# Patient Record
Sex: Female | Born: 1995 | Race: Black or African American | Hispanic: No | Marital: Married | Smoking: Never smoker
Health system: Southern US, Community
[De-identification: ages and names within clinical notes are randomized; demographics above are authoritative.]

## PROBLEM LIST (undated history)

## (undated) DIAGNOSIS — J45909 Unspecified asthma, uncomplicated: Secondary | ICD-10-CM

## (undated) DIAGNOSIS — F32A Depression, unspecified: Secondary | ICD-10-CM

## (undated) HISTORY — DX: Unspecified asthma, uncomplicated: J45.909

## (undated) HISTORY — PX: NO PAST SURGERIES: SHX2092

---
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
## 2018-08-05 DIAGNOSIS — Z008 Encounter for other general examination: Secondary | ICD-10-CM | POA: Diagnosis not present

## 2018-08-05 DIAGNOSIS — Z1329 Encounter for screening for other suspected endocrine disorder: Secondary | ICD-10-CM | POA: Diagnosis not present

## 2018-08-05 DIAGNOSIS — Z0000 Encounter for general adult medical examination without abnormal findings: Secondary | ICD-10-CM | POA: Diagnosis not present

## 2019-06-26 ENCOUNTER — Other Ambulatory Visit: Payer: Self-pay

## 2019-06-26 ENCOUNTER — Encounter: Payer: Self-pay | Admitting: Family Medicine

## 2019-06-26 ENCOUNTER — Ambulatory Visit (INDEPENDENT_AMBULATORY_CARE_PROVIDER_SITE_OTHER): Payer: Self-pay | Admitting: Family Medicine

## 2019-06-26 VITALS — BP 100/70 | HR 69 | Temp 96.4°F | Resp 12 | Ht 76.0 in | Wt 121.2 lb

## 2019-06-26 DIAGNOSIS — E559 Vitamin D deficiency, unspecified: Secondary | ICD-10-CM

## 2019-06-26 DIAGNOSIS — Z23 Encounter for immunization: Secondary | ICD-10-CM

## 2019-06-26 DIAGNOSIS — R636 Underweight: Secondary | ICD-10-CM

## 2019-06-26 DIAGNOSIS — R630 Anorexia: Secondary | ICD-10-CM

## 2019-06-26 HISTORY — DX: Underweight: R63.6

## 2019-06-26 LAB — BASIC METABOLIC PANEL
BUN: 10 mg/dL (ref 6–23)
CO2: 24 mEq/L (ref 19–32)
Calcium: 9.3 mg/dL (ref 8.4–10.5)
Chloride: 105 mEq/L (ref 96–112)
Creatinine, Ser: 0.62 mg/dL (ref 0.40–1.20)
GFR: 146.16 mL/min (ref >60.00)
Glucose, Bld: 89 mg/dL (ref 70–99)
Potassium: 4.1 mEq/L (ref 3.5–5.1)
Sodium: 138 mEq/L (ref 135–145)

## 2019-06-26 LAB — TSH: TSH: 1.87 u[IU]/mL (ref 0.35–4.50)

## 2019-06-26 LAB — CBC
HCT: 39.6 % (ref 36.0–46.0)
Hemoglobin: 13.2 g/dL (ref 12.0–15.0)
MCHC: 33.2 g/dL (ref 30.0–36.0)
MCV: 89.1 fl (ref 78.0–100.0)
Platelets: 264 10*3/uL (ref 150.0–400.0)
RBC: 4.44 Mil/uL (ref 3.87–5.11)
RDW: 13.6 % (ref 11.5–15.5)
WBC: 6.3 10*3/uL (ref 4.0–10.5)

## 2019-06-26 LAB — VITAMIN D 25 HYDROXY (VIT D DEFICIENCY, FRACTURES): VITD: 21.91 ng/mL — ABNORMAL LOW (ref 30.00–100.00)

## 2019-06-26 MED ORDER — MIRTAZAPINE 7.5 MG PO TABS: 7.5000 mg | ORAL_TABLET | Freq: Every day | ORAL | 2 refills | Status: DC

## 2019-06-26 NOTE — Progress Notes (Signed)
HPI:   Lindsey Decker is a 23 y.o. female, who is here today to establish care.  Former PCP: N/A Last preventive routine visit: > 1-2 years ago. She lives with her parents and siblings (4).  Exercises 1/week. In general she follows a healthful.  Chronic medical problems: Vit D deficiency. + Fatigue, which improved with vit D supplementation. 25 [place] vit D reported to be low at 16 about 5 months ago. She is not longer on vit D supplementation at this time. Sleeps well,about 10 hours,she feels rested when she wakes up.  Asthma: Exacerbated by respiratory viral illnesses.  She is concerned about decreased appetite. Afraid of losing wt, already underwt. Regular menses. She would like something that helps.  Negative for fever,chills,night sweats,palpitation,tremos,or diarreha.   Review of Systems  Constitutional: Negative for activity change, appetite change and unexpected weight change.  HENT: Negative for mouth sores, nosebleeds and trouble swallowing.   Eyes: Negative for redness and visual disturbance.  Respiratory: Negative for cough, shortness of breath and wheezing.   Cardiovascular: Negative for chest pain and leg swelling.  Gastrointestinal: Negative for abdominal pain, nausea and vomiting.       Negative for changes in bowel habits.  Endocrine: Negative for cold intolerance and heat intolerance.  Genitourinary: Negative for decreased urine volume, dysuria and hematuria.  Musculoskeletal: Negative for arthralgias and myalgias.  Allergic/Immunologic: Positive for environmental allergies.  Neurological: Negative for syncope, weakness and headaches.  Rest see pertinent positives and negatives per HPI.   No current outpatient medications on file prior to visit.   No current facility-administered medications on file prior to visit.   Past Medical History:  Diagnosis Date  . Asthma    No Known Allergies  History reviewed. No pertinent family  history.  Social History   Socioeconomic History  . Marital status: Single    Spouse name: Not on file  . Number of children: Not on file  . Years of education: Not on file  . Highest education level: Not on file  Occupational History  . Not on file  Tobacco Use  . Smoking status: Never Smoker  . Smokeless tobacco: Never Used  Substance and Sexual Activity  . Alcohol use: Not on file  . Drug use: Not on file  . Sexual activity: Not on file  Other Topics Concern  . Not on file  Social History Narrative  . Not on file   Social Determinants of Health   Financial Resource Strain:   . Difficulty of Paying Living Expenses: Not on file  Food Insecurity:   . Worried About Programme researcher, broadcasting/film/video in the Last Year: Not on file  . Ran Out of Food in the Last Year: Not on file  Transportation Needs:   . Lack of Transportation (Medical): Not on file  . Lack of Transportation (Non-Medical): Not on file  Physical Activity:   . Days of Exercise per Week: Not on file  . Minutes of Exercise per Session: Not on file  Stress:   . Feeling of Stress : Not on file  Social Connections:   . Frequency of Communication with Friends and Family: Not on file  . Frequency of Social Gatherings with Friends and Family: Not on file  . Attends Religious Services: Not on file  . Active Member of Clubs or Organizations: Not on file  . Attends Banker Meetings: Not on file  . Marital Status: Not on file    Vitals:  06/26/19 0658  BP: 100/70  Pulse: 69  Resp: 12  Temp: (!) 96.4 F (35.8 C)  SpO2: 98%    Body mass index is 14.76 kg/m.   Physical Exam  Nursing note and vitals reviewed. Constitutional: She is oriented to person, place, and time. She appears well-developed. No distress.  HENT:  Head: Normocephalic and atraumatic.  Mouth/Throat: Oropharynx is clear and moist and mucous membranes are normal.  Eyes: Pupils are equal, round, and reactive to light. Conjunctivae are  normal.  Cardiovascular: Normal rate and regular rhythm.  No murmur heard. Pulses:      Dorsalis pedis pulses are 2+ on the right side and 2+ on the left side.  Respiratory: Effort normal and breath sounds normal. No respiratory distress.  GI: Soft. She exhibits no mass. There is no hepatomegaly. There is no abdominal tenderness.  Musculoskeletal:        General: No edema.  Lymphadenopathy:    She has no cervical adenopathy.  Neurological: She is alert and oriented to person, place, and time. She has normal strength. No cranial nerve deficit. Gait normal.  Skin: Skin is warm. No rash noted. No erythema.  Psychiatric: She has a normal mood and affect.  Well groomed, good eye contact.    ASSESSMENT AND PLAN:  Jeimy was seen today for establish care.  Diagnoses and all orders for this visit:  Lab Results  Component Value Date   WBC 6.3 06/26/2019   HGB 13.2 06/26/2019   HCT 39.6 06/26/2019   MCV 89.1 06/26/2019   PLT 264.0 06/26/2019   Lab Results  Component Value Date   TSH 1.87 06/26/2019   Lab Results  Component Value Date   CREATININE 0.62 06/26/2019   BUN 10 06/26/2019   NA 138 06/26/2019   K 4.1 06/26/2019   CL 105 06/26/2019   CO2 24 06/26/2019    Decreased appetite Mirtazapine 7.5 mg added today. Protein containing snacks in between meals may help.  -     mirtazapine (REMERON) 7.5 MG tablet; Take 1 tablet (7.5 mg total) by mouth at bedtime.  Vitamin D deficiency, unspecified Further recommendations will be given according to lab results.  -     VITAMIN D 25 Hydroxy (Vit-D Deficiency, Fractures)  Underweight Stable wt. Regular exercise that includes wt lifting recommended. Mirtazapine may help,some side effects discussed.  -     CBC -     TSH -     Basic Metabolic Panel  Need for influenza vaccination -     Flu Vaccine QUAD 6+ mos PF IM (Fluarix Quad PF)   Return in about 3 months (around 09/24/2019).   Kevontae Burgoon G. Martinique, MD  [facility] [place] office.

## 2019-06-26 NOTE — Patient Instructions (Signed)
A few things to remember from today's visit:   Decreased appetite - Plan: mirtazapine (REMERON) 7.5 MG tablet  Vitamin D deficiency, unspecified - Plan: VITAMIN D 25 Hydroxy (Vit-D Deficiency, Fractures)  Underweight - Plan: CBC, TSH, Basic Metabolic Panel  Today mirtazapine was added to try to help with appetite. Regular physical activity, you would benefit from weight lifting.  Please be sure medication list is accurate. If a new problem present, please set up appointment sooner than planned today.

## 2019-06-29 ENCOUNTER — Encounter: Payer: Self-pay | Admitting: Family Medicine

## 2019-08-23 ENCOUNTER — Telehealth: Payer: Self-pay | Admitting: Family Medicine

## 2019-08-23 NOTE — Telephone Encounter (Signed)
Checked pt's [facility] record. Last DTAP was 03/25/1999 and last MMR vaccine was 03/25/1999. Okay for tdap and last MMR vaccine?

## 2019-08-23 NOTE — Telephone Encounter (Signed)
Pt called in stating that she needs the following vaccines MMR2 and Tdap pt would like to have a call if she is eligible for the vaccines so that she can get scheduled.

## 2019-08-25 NOTE — Telephone Encounter (Signed)
She is due for Tdap. In regard to MMR, she does not need a second one if childhood vaccination was completed (MMR x 2). Thanks, BJ

## 2019-08-25 NOTE — Telephone Encounter (Signed)
Okay to schedule NV for tdap & mmr.

## 2019-08-29 ENCOUNTER — Other Ambulatory Visit: Payer: Self-pay

## 2019-08-30 ENCOUNTER — Ambulatory Visit (INDEPENDENT_AMBULATORY_CARE_PROVIDER_SITE_OTHER): Payer: Self-pay | Admitting: *Deleted

## 2019-08-30 ENCOUNTER — Other Ambulatory Visit: Payer: Self-pay

## 2019-08-30 DIAGNOSIS — Z23 Encounter for immunization: Secondary | ICD-10-CM

## 2019-08-30 NOTE — Progress Notes (Signed)
Per orders of Dr. Martinique, injection of Tdap and MMR given by Westley Hummer. Patient tolerated injection well.

## 2019-10-28 ENCOUNTER — Other Ambulatory Visit: Payer: Self-pay | Admitting: Family Medicine

## 2019-10-28 DIAGNOSIS — R630 Anorexia: Secondary | ICD-10-CM

## 2020-03-12 ENCOUNTER — Ambulatory Visit (INDEPENDENT_AMBULATORY_CARE_PROVIDER_SITE_OTHER): Payer: BC Managed Care – PPO | Admitting: Family Medicine

## 2020-03-12 ENCOUNTER — Other Ambulatory Visit: Payer: Self-pay

## 2020-03-12 ENCOUNTER — Encounter: Payer: Self-pay | Admitting: Family Medicine

## 2020-03-12 VITALS — BP 100/70 | HR 74 | Temp 98.4°F | Resp 12 | Ht 76.0 in | Wt 142.0 lb

## 2020-03-12 DIAGNOSIS — F419 Anxiety disorder, unspecified: Secondary | ICD-10-CM

## 2020-03-12 DIAGNOSIS — H6122 Impacted cerumen, left ear: Secondary | ICD-10-CM

## 2020-03-12 DIAGNOSIS — R630 Anorexia: Secondary | ICD-10-CM

## 2020-03-12 DIAGNOSIS — N938 Other specified abnormal uterine and vaginal bleeding: Secondary | ICD-10-CM | POA: Diagnosis not present

## 2020-03-12 DIAGNOSIS — H9193 Unspecified hearing loss, bilateral: Secondary | ICD-10-CM

## 2020-03-12 LAB — POCT URINE PREGNANCY: Preg Test, Ur: NEGATIVE

## 2020-03-12 MED ORDER — MIRTAZAPINE 7.5 MG PO TABS: 7.5000 mg | ORAL_TABLET | Freq: Every day | ORAL | 0 refills | Status: DC

## 2020-03-12 NOTE — Patient Instructions (Addendum)
A few things to remember from today's visit:   DUB (dysfunctional uterine bleeding)  Bilateral hearing loss, unspecified hearing loss type - Plan: Ambulatory referral to ENT  Hearing loss of left ear due to cerumen impaction - Plan: Ambulatory referral to ENT  Decreased appetite - Plan: mirtazapine (REMERON) 7.5 MG tablet  Anxiety disorder, unspecified type  Increase the dose of mirtazapine back to 7.5 mg. Arrange appointment with psychiatrist. ENT referral was placed, somebody will call you from their office for an appointment.  PSYCHIATRIC OFFICES AND PSYCHIATRISTS IN THE AREA   When psychiatric evaluation is discussed or recommended referral is not necessary. This is a list of options around [place], [place] that you can call and arrange an appointment.  -[facility] [phone] -[facility] [phone] [facility] [phone] -[facility] [phone]  Dr Annabell Sabal [phone] Dr Marguerita Beards, MD [phone] Dr Milagros Evener, MD [phone])   Dr Jacqulynn Cadet. Bernardo Heater, MD [phone] Dr Madie Reno [facility]  Dr Andee Poles, MD [phone])  Dr Mar Daring, MD [phone]  Dr Laverna Peace [phone]  If you need refills please call your pharmacy. Do not use My Chart to request refills or for acute issues that need immediate attention.    Please be sure medication list is accurate. If a new problem present, please set up appointment sooner than planned today.

## 2020-03-12 NOTE — Progress Notes (Signed)
Chief Complaint  Patient presents with  . hearing problems   HPI: LindseyAdriena Chanti Decker is a 24 y.o. female, who is here today with her husband with above concerned. For about 3 months she has had "closed" ear feeling, gradual onset. Initially problem was intermittent but has been constant for the past 2 months. Negative for fever,chills,sore throat, nasal congestion,or rhinorrhea. No ear ache or drainage.  She tried OTC ear drops, did not help.  Denies recent travel or URI.  Her husband is concerned about abnormal menstrual period. She did a home pregnancy test and it was negative. Mild lower abdominal pain. She has not noted breast tenderness. Denies changes in bowel habits,N/V,or urinary symptoms.  Vaginal spotting on 02/23/20, stopped and LMP 03/07/20, "normal." She is not on birth control.  She is also c/o having "panic attacks" for the past few weeks. She would like a refill on Mirtazapine. Mirtazapine 7.5 mg was prescribed on 06/26/19 to help with appetite, it did help. She ran out of medication a few months ago. She feels like medication also help with anxiety. Lab Results  Component Value Date   TSH 1.87 06/26/2019   Lab Results  Component Value Date   CREATININE 0.62 06/26/2019   BUN 10 06/26/2019   NA 138 06/26/2019   K 4.1 06/26/2019   CL 105 06/26/2019   CO2 24 06/26/2019   Lab Results  Component Value Date   WBC 6.3 06/26/2019   HGB 13.2 06/26/2019   HCT 39.6 06/26/2019   MCV 89.1 06/26/2019   PLT 264.0 06/26/2019   No depressed mood or suicidal thoughts.  Her husband would like a referral to psychiatrist.  Review of Systems  Constitutional: Negative for activity change, appetite change and fatigue.  HENT: Negative for nosebleeds, sinus pain and voice change.   Respiratory: Negative for cough, shortness of breath and wheezing.   Cardiovascular: Negative for chest pain and leg swelling.  Genitourinary: Negative for decreased urine volume,  dyspareunia, dysuria, hematuria and vaginal discharge.  Musculoskeletal: Negative for gait problem and myalgias.  Skin: Negative for rash.  Allergic/Immunologic: Positive for environmental allergies.  Neurological: Negative for syncope, weakness and headaches.  Psychiatric/Behavioral: Negative for confusion and hallucinations.  Rest see pertinent positives and negatives per HPI.  No current outpatient medications on file prior to visit.   No current facility-administered medications on file prior to visit.   Past Medical History:  Diagnosis Date  . Asthma    No Known Allergies  Social History   Socioeconomic History  . Marital status: Married    Spouse name: Not on file  . Number of children: Not on file  . Years of education: Not on file  . Highest education level: Not on file  Occupational History  . Not on file  Tobacco Use  . Smoking status: Never Smoker  . Smokeless tobacco: Never Used  Substance and Sexual Activity  . Alcohol use: Not on file  . Drug use: Not on file  . Sexual activity: Not on file  Other Topics Concern  . Not on file  Social History Narrative  . Not on file   Social Determinants of Health   Financial Resource Strain:   . Difficulty of Paying Living Expenses: Not on file  Food Insecurity:   . Worried About Programme researcher, broadcasting/film/video in the Last Year: Not on file  . Ran Out of Food in the Last Year: Not on file  Transportation Needs:   . Lack of Transportation (  Medical): Not on file  . Lack of Transportation (Non-Medical): Not on file  Physical Activity:   . Days of Exercise per Week: Not on file  . Minutes of Exercise per Session: Not on file  Stress:   . Feeling of Stress : Not on file  Social Connections:   . Frequency of Communication with Friends and Family: Not on file  . Frequency of Social Gatherings with Friends and Family: Not on file  . Attends Religious Services: Not on file  . Active Member of Clubs or Organizations: Not on file    . Attends Banker Meetings: Not on file  . Marital Status: Not on file   Vitals:   03/12/20 1556  BP: 100/70  Pulse: 74  Resp: 12  Temp: 98.4 F (36.9 C)  SpO2: 100%   Wt Readings from Last 3 Encounters:  03/12/20 142 lb (64.4 kg)  06/26/19 121 lb 4 oz (55 kg)    Body mass index is 17.28 kg/m.  Physical Exam Vitals and nursing note reviewed.  Constitutional:      General: She is not in acute distress.    Appearance: She is well-developed and normal weight.  HENT:     Head: Normocephalic and atraumatic.     Right Ear: Ear canal and external ear normal. Tympanic membrane is bulging.     Left Ear: External ear normal.     Ears:     Comments: Cerumen impaction left ear.    Mouth/Throat:     Mouth: Mucous membranes are moist.     Pharynx: Oropharynx is clear.  Eyes:     Conjunctiva/sclera: Conjunctivae normal.     Pupils: Pupils are equal, round, and reactive to light.  Cardiovascular:     Rate and Rhythm: Normal rate and regular rhythm.     Heart sounds: No murmur heard.   Pulmonary:     Effort: Pulmonary effort is normal. No respiratory distress.     Breath sounds: Normal breath sounds.  Abdominal:     Palpations: Abdomen is soft. There is no hepatomegaly or mass.     Tenderness: There is abdominal tenderness in the periumbilical area. There is no guarding or rebound.    Genitourinary:    Comments: Deferred for next visit or to gyn. Lymphadenopathy:     Cervical: No cervical adenopathy.  Skin:    General: Skin is warm.     Findings: No erythema or rash.  Neurological:     Mental Status: She is alert and oriented to person, place, and time.     Cranial Nerves: No cranial nerve deficit.     Gait: Gait normal.  Psychiatric:        Mood and Affect: Affect normal. Mood is anxious.     Comments: Well groomed, good eye contact.   ASSESSMENT AND PLAN:  Lindsey Decker was seen today for hearing problems.  Diagnoses and all orders for this  visit: Orders Placed This Encounter  Procedures  . Ambulatory referral to ENT  . POCT urine pregnancy    DUB (dysfunctional uterine bleeding) We discussed possible etiologies. I do not think blood work is needed today, if problem becomes recurrent it will be considered. Pregnancy test negative.  Bilateral hearing loss, unspecified hearing loss type Hearing screening abnormal. Left ear haring loss could be associated with cerumen impaction. Right eustachian tube dysfunction. ENT appt will be arranged. Instructed about warning signs.   Hearing Screening   125Hz  250Hz  500Hz  1000Hz  2000Hz  3000Hz  4000Hz   6000Hz  8000Hz   Right ear:   Fail Fail Fail  Pass    Left ear:   Fail Fail Fail  Pass       Hearing loss of left ear due to cerumen impaction After verbal consent and explaining risk of procedure she would like to have ear lavage. Still cerumen excess in ear canal, TM seen partially, no erythema. Reporting mild improvement.  Decreased appetite Medication was helping with appetite.  -     mirtazapine (REMERON) 7.5 MG tablet; Take 1 tablet (7.5 mg total) by mouth at bedtime.  Anxiety disorder, unspecified type Mirtazapine helped, so she will resume. Some side effects discussed. A list of psychiatrist and mental health providers in the area given on AVS. Explained that referral is not needed. Continue following with psychiatrist.  -     mirtazapine (REMERON) 7.5 MG tablet; Take 1 tablet (7.5 mg total) by mouth at bedtime.   Return if symptoms worsen or fail to improve.   Sritha Chauncey G. , MD  [facility]. [place] office.  A few things to remember from today's visit:   DUB (dysfunctional uterine bleeding)  Bilateral hearing loss, unspecified hearing loss type - Plan: Ambulatory referral to ENT  Hearing loss of left ear due to cerumen impaction - Plan: Ambulatory referral to ENT  Decreased appetite - Plan: mirtazapine (REMERON) 7.5 MG tablet  Anxiety  disorder, unspecified type  Increase the dose of mirtazapine back to 7.5 mg. Arrange appointment with psychiatrist. ENT referral was placed, somebody will call you from their office for an appointment.  PSYCHIATRIC OFFICES AND PSYCHIATRISTS IN THE AREA   When psychiatric evaluation is discussed or recommended referral is not necessary. This is a list of options around [place], [facility] that you can call and arrange an appointment.  -[facility] [phone] -[facility] [phone] [facility] [phone] -[facility] [phone]  Dr [phone] Dr Annabell Sabal, MD [phone] Dr Marguerita Beards, MD [phone])   Dr [phone]. (671, MD [phone] Dr Bernardo Heater A. [facility]  Dr Madie Reno, MD [phone])  Dr Andee Poles, MD [phone]  Dr Mar Daring [phone]  If you need refills please call your pharmacy. Do not use My Chart to request refills or for acute issues that need immediate attention.    Please be sure medication list is accurate. If a new problem present, please set up appointment sooner than planned today.

## 2020-03-22 DIAGNOSIS — Z0100 Encounter for examination of eyes and vision without abnormal findings: Secondary | ICD-10-CM | POA: Diagnosis not present

## 2020-03-25 DIAGNOSIS — H6122 Impacted cerumen, left ear: Secondary | ICD-10-CM | POA: Diagnosis not present

## 2020-03-25 DIAGNOSIS — H9312 Tinnitus, left ear: Secondary | ICD-10-CM | POA: Diagnosis not present

## 2020-03-25 DIAGNOSIS — H93293 Other abnormal auditory perceptions, bilateral: Secondary | ICD-10-CM | POA: Diagnosis not present

## 2020-04-15 DIAGNOSIS — F4011 Social phobia, generalized: Secondary | ICD-10-CM | POA: Diagnosis not present

## 2020-04-15 DIAGNOSIS — F4322 Adjustment disorder with anxiety: Secondary | ICD-10-CM | POA: Diagnosis not present

## 2020-05-10 ENCOUNTER — Ambulatory Visit (HOSPITAL_COMMUNITY)
Admission: EM | Admit: 2020-05-10 | Discharge: 2020-05-10 | Disposition: A | Discharge status: Home / Self Care | Payer: BC Managed Care – PPO

## 2020-05-10 ENCOUNTER — Encounter (HOSPITAL_COMMUNITY): Payer: Self-pay | Admitting: Emergency Medicine

## 2020-05-10 ENCOUNTER — Other Ambulatory Visit: Payer: Self-pay

## 2020-05-10 DIAGNOSIS — R630 Anorexia: Secondary | ICD-10-CM

## 2020-05-10 DIAGNOSIS — F419 Anxiety disorder, unspecified: Secondary | ICD-10-CM

## 2020-05-10 NOTE — ED Triage Notes (Signed)
Presents with Depression and Anxiety increasing in severity x 2 weeks.  Denies SI.

## 2020-05-10 NOTE — BH Assessment (Signed)
Comprehensive Clinical Assessment (CCA) Screening, Triage and Referral Note  05/10/2020 Lindsey Decker [id number]    Lindsey Decker is a 24 y.o. female who presents to [facility] voluntarily accompanied by her husband for depression and anxiety. Pt states that her symptoms began about a year ago but she notice it has gotten worse since her husband had a psychotic break a few months ago. Pt states her husbands mental health affected her, no other stressors. Pt currently denies SI, HI, AVH and SIB, reports no self harm or Lindsey Decker previous attempts. Pt reports good appetite and good sleep, states she has had panic attacks in the past. Pt reports no hx of trauma/abuse, no family mental health hx or substance abuse. Pt denies any drug or alcohol abuse. Pt states she does not have a psych provider, but PCP  prescribed Remaron 7.5 mg daily. She disclosed it was initially prescribed because of poor appitate but it has also helped with her depression and her anxiety symptoms. Pt reports she is seeking therapy and med management to help with anxiety. TTS provided resources pt to follow back up at [facility] for appointment in outpatient    Diagnosis MDD, moderate         GAD  Disposition: Lindsey Paddy, FNP recommends pt is psych cleared, TTS provided pt with outpatient resources.      Visit Diagnosis:    ICD-10-CM   1. Decreased appetite  R63.0   2. Anxiety disorder, unspecified type  F41.9     Patient Reported Information How did you hear about [place]? Family/Friend   Referral name: Spouse (Spouse)   Referral phone number: No data recorded Whom do you see for routine medical problems? Primary Care   Practice/Facility Name: No data recorded  Practice/Facility Phone Number: No data recorded  Name of Contact: No data recorded  Contact Number: No data recorded  Contact Fax Number: No data recorded  Prescriber Name: No data recorded  Prescriber Address (if known): No data  recorded What Is the Reason for Your Visit/Call Today? depression and anxiety (depression and anxiety)  How Long Has This Been Causing You Problems? > than 6 months  Have You Recently Been in Any Inpatient Treatment (Hospital/Detox/Crisis Center/28-Day Program)? No   Name/Location of Program/Hospital:No data recorded  How Long Were You There? No data recorded  When Were You Discharged? No data recorded Have You Ever Received Services From [facility] Before? No   Who Do You See at [facility]? No data recorded Have You Recently Had Any Thoughts About Hurting Yourself? No   Are You Planning to Commit Suicide/Harm Yourself At This time?  No  Have you Recently Had Thoughts About Hurting Someone Lindsey Decker? No   Explanation: No data recorded Have You Used Any Alcohol or Drugs in the Past 24 Hours? No   How Long Ago Did You Use Drugs or Alcohol?  No data recorded  What Did You Use and How Much? No data recorded What Do You Feel Would Help You the Most Today? Assessment Only  Do You Currently Have a Therapist/Psychiatrist? No   Name of Therapist/Psychiatrist: No data recorded  Have You Been Recently Discharged From Any Office Practice or Programs? No   Explanation of Discharge From Practice/Program:  No data recorded    CCA Screening Triage Referral Assessment Type of Contact: Face-to-Face   Is this Initial or Reassessment? Initial  Date Telepsych consult ordered in [facility]:  05/10/20  Time Telepsych consult ordered in [facility]:  No data recorded  Patient Reported Information Reviewed? Yes   Patient Left Without Being Seen? No data recorded  Reason for Not Completing Assessment: No data recorded Collateral Involvement: No data recorded Does Patient Have a Court Appointed Legal Guardian? No data recorded  Name and Contact of Legal Guardian:  No data recorded If Minor and Not Living with Parent(s), Who has Custody? No data recorded Is CPS involved or ever been involved? No data recorded Is  APS involved or ever been involved? No data recorded Patient Determined To Be At Risk for Harm To Self or Others Based on Review of Patient Reported Information or Presenting Complaint? No data recorded  Method: No data recorded  Availability of Means: No data recorded  Intent: No data recorded  Notification Required: No data recorded  Additional Information for Danger to Others Potential:  No data recorded  Additional Comments for Danger to Others Potential:  No data recorded  Are There Guns or Other Weapons in Your Home?  No data recorded   Types of Guns/Weapons: No data recorded   Are These Weapons Safely Secured?                              No data recorded   Who Could Verify You Are Able To Have These Secured:    No data recorded Do You Have any Outstanding Charges, Pending Court Dates, Parole/Probation? No data recorded Contacted To Inform of Risk of Harm To Self or Others: No data recorded Location of Assessment: [facility] Assessment Services  Does Patient Present under Involuntary Commitment? No   IVC Papers Initial File Date: No data recorded  [place] of Residence: [place]  Patient Currently Receiving the Following Services: Not Receiving Services   Determination of Need: Routine (7 days)   Options For Referral: Medication Management;Outpatient Therapy;Other: Comment   Lindsey Decker, LCSWA

## 2020-05-10 NOTE — ED Provider Notes (Signed)
[facility] Medical Screening Exam  Patient Name: Lindsey Decker MRN: [id number] Date of Evaluation: 05/10/20 Chief Complaint: Anxiety and Depression Diagnosis: Major Depressive disorder, moderate Generalized Anxiety disorder   History of Present illness: Lindsey Decker is a 24 y.o. female patient was seen at [facility] for a psychiatric evaluation due to increased depression and anxiety. The patient was accompanied by her husband, who requested to be by her side during the assessment. The patient disclosed that her symptoms began about a year ago, but she noticed it has gotten worse since her husband had a psychotic break a few months ago. 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 The patient disclosed she had been prescribed Remeron 7.5 mg daily. She revealed it was initially prescribed because of poor appetite, but it has also helped with her depression and anxiety symptoms. She disclosed that these symptoms have been getting worse, and she came in to help keep them at bay. During the patient assessment, she is alert and oriented x 4, pleasant, and cooperative. Speech is clear and coherent, average pace, normal volume.  Her mood is euthymic, and her affect is congruent with her mood. Her thought process is coherent. Reports no auditory or visual hallucinations. No indication that the patient is responding to internal or external stimuli. She denies paranoia. No evidence of delusional thought content. She denies suicidal or homicidal ideations. She denies substance abuse. The patient denies ever being hospitalized for any mental health problems. The patient denies any family history of mental illness. She was able to answer questions appropriately.  Psychiatric Specialty Exam  Presentation  General Appearance:Appropriate for Environment  Eye Contact:Good  Speech:Clear and Coherent  Speech Volume:Normal  Handedness:Right   Mood  and Affect  Mood:Anxious;Depressed  Affect:Congruent   Thought Process  Thought Processes:Coherent  Descriptions of Associations:Intact  Orientation:Full (Time, Place and Person)  Thought Content:WDL;Logical  Hallucinations:None  Ideas of Reference:None  Suicidal Thoughts:No  Homicidal Thoughts:No   Sensorium  Memory:Recent Good;Immediate Good;Remote Good  Judgment:Good  Insight:Good   Executive Functions  Concentration:Good  Attention Span:Good  Recall:Good  Fund of Knowledge:No data recorded Language:Good   Psychomotor Activity  Psychomotor Activity:Normal   Assets  Assets:Desire for Improvement;Resilience;Social Support   Sleep  Sleep:Good  Number of hours: 8   Physical Exam: Physical Exam Vitals and nursing note reviewed. Exam conducted with a chaperone present.  Constitutional:      Appearance: Normal appearance. She is normal weight.  HENT:     Right Ear: Tympanic membrane normal.     Left Ear: Tympanic membrane normal.     Nose: Nose normal.  Cardiovascular:     Rate and Rhythm: Normal rate.     Pulses: Normal pulses.  Pulmonary:     Effort: Pulmonary effort is normal.  Musculoskeletal:        General: Normal range of motion.     Cervical back: Normal range of motion and neck supple.  Neurological:     General: No focal deficit present.     Mental Status: She is alert and oriented to person, place, and time.  Psychiatric:        Attention and Perception: Attention and perception normal.        Mood and Affect: Mood is anxious and depressed.        Speech: Speech normal.        Behavior: Behavior normal. Behavior is cooperative.        Thought Content: Thought content  normal.        Cognition and Memory: Cognition normal.        Judgment: Judgment normal.    Review of Systems  Psychiatric/Behavioral: Positive for depression. The patient is nervous/anxious.   All other systems reviewed and are negative.  Blood pressure  114/74, pulse 80, temperature 99.1 F (37.3 C), resp. rate 16, SpO2 100 %. There is no height or weight on file to calculate BMI.  Musculoskeletal: Strength & Muscle Tone: within normal limits Gait & Station: normal Patient leans: N/A   [facility] MSE Discharge Disposition for Follow up and Recommendations: Based on my evaluation the patient does not appear to have an emergency medical condition and can be discharged with resources and follow up care in outpatient services for Medication Management and Individual Therapy   Gillermo Murdoch, NP 05/10/2020, 8:41 PM

## 2020-05-10 NOTE — Discharge Instructions (Addendum)
Out patient psychiatric resources provided to the patient. Patient is to continue with Rx provided by her PCP.

## 2020-07-13 NOTE — L&D Delivery Note (Signed)
Delivery Note Labor onset: 06/10/2021  Labor Onset Time: 2300 Complete dilation at 8:57 AM  Onset of pushing at 1030, labored down and started pushing again at 1200 FHR second stage Cat 2 Analgesia/Anesthesia intrapartum: epidural  Guided pushing with maternal urge. Delivery of a viable female at 29. Fetal head delivered in ROA position.  Nuchal cord: x 1.  Infant placed on maternal abd, dried, and tactile stim. Spontaneous cry Cord double clamped after pulsation ceased and cut by FOB.  2 Rns present for birth.  Cord blood sample collected: yes Arterial cord blood sample collected: N/A  Placenta delivered Tomasa Blase via Binnie Kand Maneuver, intact, with 3 VC.  AMTSL Placenta to L&D. Uterine tone firm U/2, bleeding minimal  2nd degree laceration identified.  Anesthesia: epidural Repair: 3-0 vycril QBL/EBL (mL): 150 Complications: Nuchal x 1 APGAR: APGAR (1 MIN): 9   APGAR (5 MINS): 9   APGAR (10 MINS):   Mom to postpartum.  Baby to Couplet care / Skin to Skin. Dr Sallye Ober notified of pt status and POC  Carollee Leitz MSN, CNM 06/11/2021, 2:08 PM

## 2020-11-19 DIAGNOSIS — Z681 Body mass index (BMI) 19 or less, adult: Secondary | ICD-10-CM | POA: Diagnosis not present

## 2020-11-19 DIAGNOSIS — Z0001 Encounter for general adult medical examination with abnormal findings: Secondary | ICD-10-CM | POA: Diagnosis not present

## 2020-11-19 DIAGNOSIS — Z0000 Encounter for general adult medical examination without abnormal findings: Secondary | ICD-10-CM | POA: Diagnosis not present

## 2020-11-19 DIAGNOSIS — R112 Nausea with vomiting, unspecified: Secondary | ICD-10-CM | POA: Diagnosis not present

## 2020-11-19 DIAGNOSIS — Z113 Encounter for screening for infections with a predominantly sexual mode of transmission: Secondary | ICD-10-CM | POA: Diagnosis not present

## 2020-11-19 DIAGNOSIS — Z124 Encounter for screening for malignant neoplasm of cervix: Secondary | ICD-10-CM | POA: Diagnosis not present

## 2020-11-19 DIAGNOSIS — Z01411 Encounter for gynecological examination (general) (routine) with abnormal findings: Secondary | ICD-10-CM | POA: Diagnosis not present

## 2020-11-19 DIAGNOSIS — N912 Amenorrhea, unspecified: Secondary | ICD-10-CM | POA: Diagnosis not present

## 2020-12-12 DIAGNOSIS — Z3A14 14 weeks gestation of pregnancy: Secondary | ICD-10-CM | POA: Diagnosis not present

## 2020-12-12 DIAGNOSIS — N925 Other specified irregular menstruation: Secondary | ICD-10-CM | POA: Diagnosis not present

## 2020-12-12 DIAGNOSIS — F419 Anxiety disorder, unspecified: Secondary | ICD-10-CM | POA: Diagnosis not present

## 2020-12-12 DIAGNOSIS — O3680X9 Pregnancy with inconclusive fetal viability, other fetus: Secondary | ICD-10-CM | POA: Diagnosis not present

## 2020-12-12 DIAGNOSIS — Z3402 Encounter for supervision of normal first pregnancy, second trimester: Secondary | ICD-10-CM | POA: Diagnosis not present

## 2020-12-12 LAB — OB RESULTS CONSOLE RUBELLA ANTIBODY, IGM: Rubella: IMMUNE

## 2020-12-12 LAB — OB RESULTS CONSOLE HIV ANTIBODY (ROUTINE TESTING): HIV: NONREACTIVE

## 2020-12-12 LAB — OB RESULTS CONSOLE ABO/RH: RH Type: POSITIVE

## 2020-12-12 LAB — OB RESULTS CONSOLE HEPATITIS B SURFACE ANTIGEN: Hepatitis B Surface Ag: NEGATIVE

## 2020-12-12 LAB — OB RESULTS CONSOLE ANTIBODY SCREEN: Antibody Screen: NEGATIVE

## 2020-12-20 ENCOUNTER — Emergency Department (HOSPITAL_COMMUNITY)
Admission: EM | Admit: 2020-12-20 | Discharge: 2020-12-20 | Disposition: A | Discharge status: Home / Self Care | Payer: Medicaid Other | Attending: Emergency Medicine | Admitting: Emergency Medicine

## 2020-12-20 ENCOUNTER — Other Ambulatory Visit: Payer: Self-pay

## 2020-12-20 ENCOUNTER — Emergency Department (HOSPITAL_COMMUNITY): Payer: Medicaid Other

## 2020-12-20 ENCOUNTER — Encounter (HOSPITAL_COMMUNITY): Payer: Self-pay | Admitting: Emergency Medicine

## 2020-12-20 DIAGNOSIS — Y93G1 Activity, food preparation and clean up: Secondary | ICD-10-CM | POA: Diagnosis not present

## 2020-12-20 DIAGNOSIS — S61215A Laceration without foreign body of left ring finger without damage to nail, initial encounter: Secondary | ICD-10-CM | POA: Insufficient documentation

## 2020-12-20 DIAGNOSIS — S6992XA Unspecified injury of left wrist, hand and finger(s), initial encounter: Secondary | ICD-10-CM | POA: Diagnosis present

## 2020-12-20 DIAGNOSIS — M2559 Pain in other specified joint: Secondary | ICD-10-CM | POA: Diagnosis not present

## 2020-12-20 DIAGNOSIS — J45909 Unspecified asthma, uncomplicated: Secondary | ICD-10-CM | POA: Insufficient documentation

## 2020-12-20 DIAGNOSIS — W208XXA Other cause of strike by thrown, projected or falling object, initial encounter: Secondary | ICD-10-CM | POA: Diagnosis not present

## 2020-12-20 MED ORDER — BUPIVACAINE HCL (PF) 0.5 % IJ SOLN
10.0000 mL | Freq: Once | INTRAMUSCULAR | Status: AC
  Administered 2020-12-20: 20:00:00 10 mL
  Filled 2020-12-20: qty 30

## 2020-12-20 MED ORDER — BUPIVACAINE HCL 0.5 % IJ SOLN: 10.0000 mL | Freq: Once | INTRAMUSCULAR | Status: DC

## 2020-12-20 NOTE — ED Triage Notes (Signed)
Patient presents with laceration into the ring finger on the left hand. Laceration occurred while the patient was washing dishes today.

## 2020-12-20 NOTE — Discharge Instructions (Addendum)
  Wound Care - Laceration You may remove the bandage after 24 hours. Clean the wound and surrounding area gently with tap water and mild soap. Rinse well and blot dry. Do not scrub the wound, as this may cause the wound edges to come apart. You may shower, but avoid submerging the wound, such as with a bath or swimming. Clean the wound daily to prevent infection. Do not use cleaners such as hydrogen peroxide or alcohol.   Scar reduction: Application of a topical antibiotic ointment, such as Neosporin, after the wound has begun to close and heal well can decrease scab formation and reduce scarring. After the wound has healed and wound closures have been removed, application of ointments such as Aquaphor can also reduce scar formation.  The key to scar reduction is keeping the skin well hydrated and supple. Drinking plenty of water throughout the day (At least eight 8oz glasses of water a day) is essential to staying well hydrated.  Sun exposure: Keep the wound out of the sun. After the wound has healed, continue to protect it from the sun by wearing protective clothing or applying sunscreen.  Pain: You may use Tylenol, naproxen, or ibuprofen for pain. Antiinflammatory medications: Take 600 mg of ibuprofen every 6 hours or 440 mg (over the counter dose) to 500 mg (prescription dose) of naproxen every 12 hours for the next 3 days. After this time, these medications may be used as needed for pain. Take these medications with food to avoid upset stomach. Choose only one of these medications, do not take them together. Acetaminophen (generic for Tylenol): Should you continue to have additional pain while taking the ibuprofen or naproxen, you may add in acetaminophen as needed. Your daily total maximum amount of acetaminophen from all sources should be limited to 4000mg /day for persons without liver problems, or 2000mg /day for those with liver problems.  Suture/staple removal: Sutures should dissolve and fall  out on their own.  If they are still present after 14 days, may return to the emergency department or go to an urgent care for removal.  Return to the ED sooner should the wound edges come apart or signs of infection arise, such as spreading redness, puffiness/swelling, pus draining from the wound, severe increase in pain, fever over 100.72F, or any other major issues.  For prescription assistance, may try using prescription discount sites or apps, such as goodrx.com

## 2020-12-20 NOTE — ED Provider Notes (Signed)
[facility] Provider Note   CSN: [id number] Arrival date & time: 12/20/20  1545     History Chief Complaint  Patient presents with   Laceration    Lindsey Decker is a 25 y.o. female.  HPI     Lindsey Decker is a 25 y.o. female, with a history of asthma, presenting to the ED with injury to left ring finger that occurred shortly prior to arrival. Patient states a plate fell, broke, and struck her finger.  Complains of a laceration to the finger.  Up-to-date on tetanus vaccination. Denies numbness, weakness, other injuries.      Past Medical History:  Diagnosis Date   Asthma     Patient Active Problem List   Diagnosis Date Noted   Vitamin D deficiency, unspecified 06/26/2019   Underweight 06/26/2019    History reviewed. No pertinent surgical history.   OB History     Gravida  1   Para      Term      Preterm      AB      Living         SAB      IAB      Ectopic      Multiple      Live Births              History reviewed. No pertinent family history.  Social History   Tobacco Use   Smoking status: Never   Smokeless tobacco: Never    Home Medications Prior to Admission medications   Medication Sig Start Date End Date Taking? Authorizing Provider  mirtazapine (REMERON) 7.5 MG tablet Take 1 tablet (7.5 mg total) by mouth at bedtime. 03/12/20   [place], Betty G, MD    Allergies    Patient has no known allergies.  Review of Systems   Review of Systems  Musculoskeletal:  Positive for arthralgias.  Skin:  Negative for wound.  Neurological:  Negative for weakness and numbness.   Physical Exam Updated Vital Signs BP 113/71 (BP Location: Left Arm)   Pulse 81   Temp 98.3 F (36.8 C) (Oral)   Resp 18   Ht 5\' 11"  (1.803 m)   Wt 66.2 kg   LMP 03/07/2020   SpO2 100%   BMI 20.36 kg/m   Physical Exam Vitals and nursing note reviewed.  Constitutional:      General: She is  not in acute distress.    Appearance: Normal appearance. She is well-developed. She is not diaphoretic.  HENT:     Head: Normocephalic and atraumatic.  Eyes:     Conjunctiva/sclera: Conjunctivae normal.  Cardiovascular:     Rate and Rhythm: Normal rate and regular rhythm.  Pulmonary:     Effort: Pulmonary effort is normal.  Musculoskeletal:     Cervical back: Neck supple.     Comments: Approximately 2 cm laceration to the lateral left ring finger in a partial avulsion pattern.  There is some associated bruising and swelling. Full flexion and extension at the DIP, PIP, and MCP joints of the finger.  Skin:    General: Skin is warm and dry.     Capillary Refill: Capillary refill takes less than 2 seconds.     Coloration: Skin is not pale.  Neurological:     Mental Status: She is alert.     Comments: Sensation light touch grossly intact in the left ring finger. Flexion and extension against resistance intact in the finger.  Psychiatric:        Behavior: Behavior normal.    ED Results / Procedures / Treatments   Labs (all labs ordered are listed, but only abnormal results are displayed) Labs Reviewed - No data to display  EKG None  Radiology DG Finger Ring Left  Result Date: 12/20/2020 CLINICAL DATA:  Laceration of the ring finger. EXAM: LEFT RING FINGER 2+V COMPARISON:  None. FINDINGS: The joint spaces are maintained. No acute fracture is identified. No radiopaque foreign body but exam limited by overlying bandage. IMPRESSION: No acute bony findings or radiopaque foreign body. Electronically Signed   By: Rudie Meyer M.D.   On: 12/20/2020 17:52    Procedures .Nerve Block  Date/Time: 12/20/2020 6:50 PM Performed by: Anselm Pancoast, PA-C Authorized by: Anselm Pancoast, PA-C   Consent:    Consent obtained:  Verbal   Consent given by:  Patient   Risks, benefits, and alternatives were discussed: yes     Risks discussed:  Infection, nerve damage, swelling, unsuccessful block, pain  and bleeding Universal protocol:    Procedure explained and questions answered to patient or proxy's satisfaction: yes     Patient identity confirmed:  Verbally with patient and provided demographic data Indications:    Indications:  Procedural anesthesia Location:    Body area:  Upper extremity   Upper extremity nerve blocked: Left ring finger; digital block.   Laterality:  Left Pre-procedure details:    Skin preparation:  Alcohol Procedure details:    Block needle gauge:  25 G   Anesthetic injected:  Bupivacaine 0.5% w/o epi   Injection procedure:  Anatomic landmarks identified, anatomic landmarks palpated, incremental injection, introduced needle and negative aspiration for blood Post-procedure details:    Outcome:  Anesthesia achieved   Procedure completion:  Tolerated well, no immediate complications .Marland KitchenLaceration Repair  Date/Time: 12/20/2020 7:30 PM Performed by: Anselm Pancoast, PA-C Authorized by: Anselm Pancoast, PA-C   Consent:    Consent obtained:  Verbal   Consent given by:  Patient   Risks, benefits, and alternatives were discussed: yes     Risks discussed:  Infection, need for additional repair, pain, poor cosmetic result, poor wound healing, nerve damage, tendon damage and vascular damage Universal protocol:    Procedure explained and questions answered to patient or proxy's satisfaction: yes     Patient identity confirmed:  Verbally with patient and provided demographic data Anesthesia:    Anesthesia method:  Nerve block   Block location:  Digital   Block needle gauge:  25 G   Block anesthetic:  Bupivacaine 0.5% w/o epi   Block injection procedure:  Anatomic landmarks identified, anatomic landmarks palpated, introduced needle, negative aspiration for blood and incremental injection   Block outcome:  Anesthesia achieved Laceration details:    Location:  Finger   Finger location:  L ring finger   Length (cm):  2 Pre-procedure details:    Preparation:  Patient was  prepped and draped in usual sterile fashion Exploration:    Imaging obtained: x-ray     Imaging outcome: foreign body not noted     Wound exploration: wound explored through full range of motion and entire depth of wound visualized     Contaminated: no   Treatment:    Area cleansed with:  Povidone-iodine and saline   Amount of cleaning:  Standard   Irrigation solution:  Sterile saline   Irrigation method:  Syringe Skin repair:    Repair method:  Sutures   Suture  size:  4-0   Wound skin closure material used: Vicryl.   Suture technique:  Simple interrupted   Number of sutures:  3 Approximation:    Approximation:  Close Repair type:    Repair type:  Simple Post-procedure details:    Dressing:  Non-adherent dressing and sterile dressing   Procedure completion:  Tolerated well, no immediate complications   Medications Ordered in ED Medications - No data to display  ED Course  I have reviewed the triage vital signs and the nursing notes.  Pertinent labs & imaging results that were available during my care of the patient were reviewed by me and considered in my medical decision making (see chart for details).    MDM Rules/Calculators/A&P                          Patient presents with a finger laceration. Laceration was thoroughly cleaned and flushed. Circulation, motor function, sensation intact before and after laceration repair. The patient was given instructions for home care as well as return precautions. Patient voices understanding of these instructions, accepts the plan, and is comfortable with discharge.  I reviewed and interpreted the patient's radiological studies.    Final Clinical Impression(s) / ED Diagnoses Final diagnoses:  None    Rx / DC Orders ED Discharge Orders     None        Concepcion Living 12/21/20 2116    Arby Barrette, MD 12/24/20 2056

## 2020-12-20 NOTE — ED Provider Notes (Signed)
Emergency Medicine Provider Triage Evaluation Note  Lindsey Decker , a 25 y.o. female  was evaluated in triage.  Pt complains of serration to the left ring finger that occurred shortly prior to arrival around 4 PM. States a plate fell onto her finger, broke, and cut the finger. Tetanus vaccination up-to-date.  Review of Systems  Positive: Ring finger laceration, swelling Negative: Numbness, weakness  Physical Exam  BP 113/71 (BP Location: Left Arm)   Pulse 81   Temp 98.3 F (36.8 C) (Oral)   Resp 18   Ht 5\' 11"  (1.803 m)   Wt 66.2 kg   SpO2 100%   BMI 20.36 kg/m  Gen:   Awake, no distress   Resp:  Normal effort  MSK:   Moves extremities without difficulty  Other:    Medical Decision Making  Medically screening exam initiated at 5:13 PM.  Appropriate orders placed.  Ronk was informed that the remainder of the evaluation will be completed by another provider, this initial triage assessment does not replace that evaluation, and the importance of remaining in the ED until their evaluation is complete.     Inis Sizer, PA-C 12/20/20 1715    02/19/21, MD 12/24/20 2056

## 2020-12-23 ENCOUNTER — Telehealth: Payer: Self-pay

## 2020-12-23 NOTE — Telephone Encounter (Signed)
Transition Care Management Unsuccessful Follow-up Telephone Call  Date of discharge and from where:  12/20/2020 from [facility]  Attempts:  1st Attempt  Reason for unsuccessful TCM follow-up call:  Left voice message

## 2020-12-24 NOTE — Telephone Encounter (Signed)
Transition Care Management Follow-up Telephone Call Date of discharge and from where: 12/20/2020 from [facility] How have you been since you were released from the hospital? Pt states that she is feeling well and has no pain.  Any questions or concerns? No  Items Reviewed: Did the pt receive and understand the discharge instructions provided? Yes  Medications obtained and verified? Yes  Other? No  Any new allergies since your discharge? No  Dietary orders reviewed? No Do you have support at home? Yes   Functional Questionnaire: (I = Independent and D = Dependent) ADLs: I  Bathing/Dressing- I  Meal Prep- I  Eating- I  Maintaining continence- I  Transferring/Ambulation- I  Managing Meds- I   Follow up appointments reviewed:  PCP Hospital f/u appt confirmed? No   Specialist Hospital f/u appt confirmed? No   Are transportation arrangements needed? No  If their condition worsens, is the pt aware to call PCP or go to the Emergency Dept.? Yes Was the patient provided with contact information for the PCP's office or ED? Yes Was to pt encouraged to call back with questions or concerns? Yes

## 2021-01-17 DIAGNOSIS — Z3A19 19 weeks gestation of pregnancy: Secondary | ICD-10-CM | POA: Diagnosis not present

## 2021-01-17 DIAGNOSIS — Z363 Encounter for antenatal screening for malformations: Secondary | ICD-10-CM | POA: Diagnosis not present

## 2021-01-30 ENCOUNTER — Encounter (HOSPITAL_COMMUNITY): Payer: Self-pay | Admitting: *Deleted

## 2021-01-30 ENCOUNTER — Inpatient Hospital Stay (HOSPITAL_COMMUNITY)
Admission: AD | Admit: 2021-01-30 | Discharge: 2021-01-30 | Disposition: A | Discharge status: Home / Self Care | Payer: Medicaid Other | Attending: Obstetrics & Gynecology | Admitting: Obstetrics & Gynecology

## 2021-01-30 DIAGNOSIS — Z3492 Encounter for supervision of normal pregnancy, unspecified, second trimester: Secondary | ICD-10-CM | POA: Diagnosis present

## 2021-01-30 DIAGNOSIS — O368120 Decreased fetal movements, second trimester, not applicable or unspecified: Secondary | ICD-10-CM

## 2021-01-30 DIAGNOSIS — Z3A21 21 weeks gestation of pregnancy: Secondary | ICD-10-CM | POA: Diagnosis not present

## 2021-01-30 NOTE — MAU Note (Signed)
Pt stated she usually has daily fetal movement she is able to feel. Has not felt any for 2 days. Denies any pain, cramping or bleeding .

## 2021-01-30 NOTE — MAU Provider Note (Signed)
Event Date/Time   First Provider Initiated Contact with Patient 01/30/21 1500      S [name] is a 25 y.o. G1P0 at [redacted]w[redacted]d patient who presents to MAU today with complaint of absent fetal movement for the past two days. She states her baby typically movement constantly. She denies pain, vaginal bleeding, abdominal tenderness, abnormal vaginal discharge, fever or falls. She receives prenatal care with [facility].  O BP 110/60   Pulse 85   Temp 98.2 F (36.8 C)   Resp 18   Wt 72.6 kg   LMP 03/07/2020   BMI 22.32 kg/m    Physical Exam Vitals and nursing note reviewed. Exam conducted with a chaperone present.  Constitutional:      General: She is in acute distress.     Appearance: Normal appearance.     Comments: Pt verbalizes feeling afraid for her baby  Cardiovascular:     Rate and Rhythm: Normal rate.     Pulses: Normal pulses.  Abdominal:     Comments: Gravid  Skin:    Capillary Refill: Capillary refill takes less than 2 seconds.  Neurological:     Mental Status: She is alert and oriented to person, place, and time.  Psychiatric:        Behavior: Behavior normal.        Thought Content: Thought content normal.        Judgment: Judgment normal.    A Medical screening exam complete Discussed developmental milestones, expectations for fetal movement at current GA No other complaints, imaging not indicated FHT 142 by Doppler  P Discharge from MAU in stable condition with return precautions Warning signs for worsening condition that would warrant emergency follow-up discussed Patient may return to MAU as needed   Clayton Bibles, [place] 01/30/2021 7:15 PM

## 2021-02-13 DIAGNOSIS — Z331 Pregnant state, incidental: Secondary | ICD-10-CM | POA: Diagnosis not present

## 2021-02-13 DIAGNOSIS — Z369 Encounter for antenatal screening, unspecified: Secondary | ICD-10-CM | POA: Diagnosis not present

## 2021-02-13 DIAGNOSIS — Z362 Encounter for other antenatal screening follow-up: Secondary | ICD-10-CM | POA: Diagnosis not present

## 2021-02-13 DIAGNOSIS — Z3A23 23 weeks gestation of pregnancy: Secondary | ICD-10-CM | POA: Diagnosis not present

## 2021-02-13 DIAGNOSIS — F419 Anxiety disorder, unspecified: Secondary | ICD-10-CM | POA: Diagnosis not present

## 2021-03-14 DIAGNOSIS — Z362 Encounter for other antenatal screening follow-up: Secondary | ICD-10-CM | POA: Diagnosis not present

## 2021-03-28 DIAGNOSIS — Z331 Pregnant state, incidental: Secondary | ICD-10-CM | POA: Diagnosis not present

## 2021-03-28 DIAGNOSIS — F419 Anxiety disorder, unspecified: Secondary | ICD-10-CM | POA: Diagnosis not present

## 2021-03-28 DIAGNOSIS — E559 Vitamin D deficiency, unspecified: Secondary | ICD-10-CM | POA: Diagnosis not present

## 2021-03-28 DIAGNOSIS — Z23 Encounter for immunization: Secondary | ICD-10-CM | POA: Diagnosis not present

## 2021-04-03 DIAGNOSIS — F410 Panic disorder [episodic paroxysmal anxiety] without agoraphobia: Secondary | ICD-10-CM | POA: Diagnosis not present

## 2021-04-03 DIAGNOSIS — F331 Major depressive disorder, recurrent, moderate: Secondary | ICD-10-CM | POA: Diagnosis not present

## 2021-05-08 ENCOUNTER — Ambulatory Visit: Payer: Self-pay | Admitting: Family Medicine

## 2021-05-12 DIAGNOSIS — Z3493 Encounter for supervision of normal pregnancy, unspecified, third trimester: Secondary | ICD-10-CM | POA: Diagnosis not present

## 2021-05-12 DIAGNOSIS — Z113 Encounter for screening for infections with a predominantly sexual mode of transmission: Secondary | ICD-10-CM | POA: Diagnosis not present

## 2021-05-12 DIAGNOSIS — Z3A36 36 weeks gestation of pregnancy: Secondary | ICD-10-CM | POA: Diagnosis not present

## 2021-05-12 DIAGNOSIS — O26849 Uterine size-date discrepancy, unspecified trimester: Secondary | ICD-10-CM | POA: Diagnosis not present

## 2021-05-12 LAB — OB RESULTS CONSOLE GBS: GBS: NEGATIVE

## 2021-05-15 LAB — OB RESULTS CONSOLE GC/CHLAMYDIA
Chlamydia: NEGATIVE
Gonorrhea: NEGATIVE

## 2021-06-04 ENCOUNTER — Ambulatory Visit (HOSPITAL_BASED_OUTPATIENT_CLINIC_OR_DEPARTMENT_OTHER): Payer: Medicaid Other | Admitting: Family Medicine

## 2021-06-07 ENCOUNTER — Inpatient Hospital Stay (HOSPITAL_COMMUNITY): Admit: 2021-06-07 | Payer: Self-pay

## 2021-06-10 ENCOUNTER — Other Ambulatory Visit: Payer: Self-pay | Admitting: Obstetrics and Gynecology

## 2021-06-10 ENCOUNTER — Other Ambulatory Visit: Payer: Self-pay

## 2021-06-10 ENCOUNTER — Encounter (HOSPITAL_COMMUNITY): Payer: Self-pay | Admitting: Obstetrics and Gynecology

## 2021-06-10 ENCOUNTER — Inpatient Hospital Stay (EMERGENCY_DEPARTMENT_HOSPITAL)
Admission: AD | Admit: 2021-06-10 | Discharge: 2021-06-10 | Disposition: A | Discharge status: Home / Self Care | Payer: Medicaid Other | Source: Home / Self Care | Attending: Obstetrics and Gynecology | Admitting: Obstetrics and Gynecology

## 2021-06-10 DIAGNOSIS — O479 False labor, unspecified: Secondary | ICD-10-CM | POA: Diagnosis not present

## 2021-06-10 DIAGNOSIS — O471 False labor at or after 37 completed weeks of gestation: Secondary | ICD-10-CM | POA: Insufficient documentation

## 2021-06-10 DIAGNOSIS — Z3A40 40 weeks gestation of pregnancy: Secondary | ICD-10-CM | POA: Insufficient documentation

## 2021-06-10 HISTORY — DX: Depression, unspecified: F32.A

## 2021-06-10 NOTE — MAU Provider Note (Signed)
Chief Complaint:  Contractions (Every )   Seen by provider at 2045   HPI: Lindsey Decker is a 25 y.o. G1P0 at 78w3dwho presents to maternity admissions reporting painful contractions.  Is scheduled for post dates induction on Saturday.  . She reports good fetal movement, denies LOF, vaginal bleeding, vaginal itching/burning, urinary symptoms, h/a, dizziness, n/v, diarrhea, constipation or fever/chills.   Abdominal Pain This is a new problem. The current episode started today. The problem occurs intermittently. The quality of the pain is cramping. Pertinent negatives include no fever, nausea or vomiting. Nothing aggravates the pain. The pain is relieved by Nothing. She has tried nothing for the symptoms.   RN Note: Pt reports ctx every 5 mins which started 06/09/21 appx 5pm, pain scale 6/10, denies any vaginal bleeding or leakage of fluid, + FM.  Past Medical History: Past Medical History:  Diagnosis Date   Depression     Past obstetric history: OB History  Gravida Para Term Preterm AB Living  1            SAB IAB Ectopic Multiple Live Births               # Outcome Date GA Lbr Len/2nd Weight Sex Delivery Anes PTL Lv  1 Current             Past Surgical History: Past Surgical History:  Procedure Laterality Date   NO PAST SURGERIES      Family History: History reviewed. No pertinent family history.  Social History: Social History   Tobacco Use   Smoking status: Never   Smokeless tobacco: Never  Vaping Use   Vaping Use: Never used  Substance Use Topics   Alcohol use: Never   Drug use: Never    Allergies: No Known Allergies  Meds:  Medications Prior to Admission  Medication Sig Dispense Refill Last Dose   mirtazapine (REMERON) 7.5 MG tablet Take 7.5 mg by mouth at bedtime.   06/09/2021   Prenatal Vit-Fe Fumarate-FA (MULTIVITAMIN-PRENATAL) 27-0.8 MG TABS tablet Take 1 tablet by mouth daily at 12 noon.   06/09/2021   Vitamin D, Ergocalciferol,  (DRISDOL) 1.25 MG (50000 UNIT) CAPS capsule Take 50,000 Units by mouth once.       I have reviewed patient's Past Medical Hx, Surgical Hx, Family Hx, Social Hx, medications and allergies.   ROS:  Review of Systems  Constitutional:  Negative for fever.  Gastrointestinal:  Positive for abdominal pain. Negative for nausea and vomiting.  Other systems negative  Physical Exam  Patient Vitals for the past 24 hrs:  BP Temp Temp src Pulse Resp  06/10/21 2016 118/78 98.7 F (37.1 C) Oral 92 18   Constitutional: Well-developed, well-nourished female in no acute distress.  Cardiovascular: normal rate and rhythm Respiratory: normal effort, clear to auscultation bilaterally GI: Abd soft, non-tender, gravid appropriate for gestational age.   No rebound or guarding. MS: Extremities nontender, no edema, normal ROM Neurologic: Alert and oriented x 4.  GU: Neg CVAT.  PELVIC EXAM:  Dilation: 1 Effacement (%): 50 Station: -1 Presentation: Vertex Exam by:: [place] RN  FHT:  Baseline 140 , moderate variability, accelerations present, no decelerations Contractions: q 2-6 mins Irregular     Labs: No results found for this or any previous visit (from the past 24 hour(s)). A/Positive/-- (06/02 0000)  Imaging:  No results found.  MAU Course/MDM: I have ordered labs and reviewed results.  NST reviewed Treatments in MAU included EFM  Offered observation and recheck  of cervix in one hour  Patient declines.  Wants to go home Reviewed labor precautions in detail  They live close by.    Assessment: 1. Irregular uterine contractions   2.     Prodromal vs Latent labor 3.     Reactive FHR tracing  Plan: Discharge home Labor precautions and fetal kick counts Follow up in Office for prenatal visits and recheck  Follow-up Information     [facility] Follow up.   Specialty: Obstetrics and Gynecology Contact information: [address] [place]  [address] [phone]                 Pt stable at time of discharge.  Wynelle Bourgeois CNM, MSN Certified Nurse-Midwife 06/10/2021 8:52 PM

## 2021-06-10 NOTE — MAU Note (Signed)
Pt reports ctx every 5 mins which started 06/09/21 appx 5pm, pain scale 6/10, denies any vaginal bleeding or leakage of fluid, + FM.

## 2021-06-11 ENCOUNTER — Other Ambulatory Visit: Payer: Self-pay

## 2021-06-11 ENCOUNTER — Encounter (HOSPITAL_COMMUNITY): Payer: Self-pay | Admitting: Obstetrics & Gynecology

## 2021-06-11 ENCOUNTER — Inpatient Hospital Stay (HOSPITAL_COMMUNITY): Payer: Medicaid Other | Admitting: Anesthesiology

## 2021-06-11 ENCOUNTER — Inpatient Hospital Stay (HOSPITAL_COMMUNITY)
Admission: AD | Admit: 2021-06-11 | Discharge: 2021-06-13 | DRG: 807 | Disposition: A | Discharge status: Home / Self Care | Payer: Medicaid Other | Attending: Obstetrics & Gynecology | Admitting: Obstetrics & Gynecology

## 2021-06-11 DIAGNOSIS — O26893 Other specified pregnancy related conditions, third trimester: Secondary | ICD-10-CM | POA: Diagnosis not present

## 2021-06-11 DIAGNOSIS — Z3A40 40 weeks gestation of pregnancy: Secondary | ICD-10-CM | POA: Diagnosis not present

## 2021-06-11 DIAGNOSIS — Z20822 Contact with and (suspected) exposure to covid-19: Secondary | ICD-10-CM | POA: Diagnosis present

## 2021-06-11 LAB — RESP PANEL BY RT-PCR (FLU A&B, COVID) ARPGX2
Influenza A by PCR: NEGATIVE
Influenza B by PCR: NEGATIVE
SARS Coronavirus 2 by RT PCR: NEGATIVE

## 2021-06-11 LAB — TYPE AND SCREEN
ABO/RH(D): A POS
Antibody Screen: NEGATIVE

## 2021-06-11 LAB — HIV ANTIBODY (ROUTINE TESTING W REFLEX): HIV Screen 4th Generation wRfx: NONREACTIVE

## 2021-06-11 LAB — CBC
HCT: 33.3 % — ABNORMAL LOW (ref 36.0–46.0)
Hemoglobin: 10.4 g/dL — ABNORMAL LOW (ref 12.0–15.0)
MCH: 25.4 pg — ABNORMAL LOW (ref 26.0–34.0)
MCHC: 31.2 g/dL (ref 30.0–36.0)
MCV: 81.2 fL (ref 80.0–100.0)
Platelets: 260 10*3/uL (ref 150–400)
RBC: 4.1 MIL/uL (ref 3.87–5.11)
RDW: 15.1 % (ref 11.5–15.5)
WBC: 16.4 10*3/uL — ABNORMAL HIGH (ref 4.0–10.5)
nRBC: 0 % (ref 0.0–0.2)

## 2021-06-11 LAB — RPR: RPR Ser Ql: NONREACTIVE

## 2021-06-11 MED ORDER — OXYCODONE HCL 5 MG PO TABS: 5.0000 mg | ORAL_TABLET | ORAL | Status: DC | PRN

## 2021-06-11 MED ORDER — ONDANSETRON HCL 4 MG/2ML IJ SOLN: 4.0000 mg | Freq: Four times a day (QID) | INTRAMUSCULAR | Status: DC | PRN

## 2021-06-11 MED ORDER — TETANUS-DIPHTH-ACELL PERTUSSIS 5-2.5-18.5 LF-MCG/0.5 IM SUSY: 0.5000 mL | PREFILLED_SYRINGE | Freq: Once | INTRAMUSCULAR | Status: DC

## 2021-06-11 MED ORDER — FENTANYL-BUPIVACAINE-NACL 0.5-0.125-0.9 MG/250ML-% EP SOLN
12.0000 mL/h | EPIDURAL | Status: DC | PRN
  Filled 2021-06-11 (×2): qty 250

## 2021-06-11 MED ORDER — DIPHENHYDRAMINE HCL 50 MG/ML IJ SOLN: 12.5000 mg | INTRAMUSCULAR | Status: DC | PRN

## 2021-06-11 MED ORDER — PHENYLEPHRINE 40 MCG/ML (10ML) SYRINGE FOR IV PUSH (FOR BLOOD PRESSURE SUPPORT)
80.0000 ug | PREFILLED_SYRINGE | INTRAVENOUS | Status: DC | PRN
  Filled 2021-06-11: qty 10

## 2021-06-11 MED ORDER — OXYTOCIN-SODIUM CHLORIDE 30-0.9 UT/500ML-% IV SOLN
2.5000 [IU]/h | INTRAVENOUS | Status: DC
  Filled 2021-06-11: qty 500

## 2021-06-11 MED ORDER — ONDANSETRON HCL 4 MG PO TABS: 4.0000 mg | ORAL_TABLET | ORAL | Status: DC | PRN

## 2021-06-11 MED ORDER — ONDANSETRON HCL 4 MG/2ML IJ SOLN: 4.0000 mg | INTRAMUSCULAR | Status: DC | PRN

## 2021-06-11 MED ORDER — WITCH HAZEL-GLYCERIN EX PADS: 1.0000 "application " | MEDICATED_PAD | CUTANEOUS | Status: DC | PRN

## 2021-06-11 MED ORDER — DIPHENHYDRAMINE HCL 25 MG PO CAPS: 25.0000 mg | ORAL_CAPSULE | Freq: Four times a day (QID) | ORAL | Status: DC | PRN

## 2021-06-11 MED ORDER — OXYCODONE-ACETAMINOPHEN 5-325 MG PO TABS: 2.0000 | ORAL_TABLET | ORAL | Status: DC | PRN

## 2021-06-11 MED ORDER — ACETAMINOPHEN 325 MG PO TABS: 650.0000 mg | ORAL_TABLET | ORAL | Status: DC | PRN

## 2021-06-11 MED ORDER — SENNOSIDES-DOCUSATE SODIUM 8.6-50 MG PO TABS
2.0000 | ORAL_TABLET | Freq: Every day | ORAL | Status: DC
  Administered 2021-06-12: 2 via ORAL
  Filled 2021-06-11 (×2): qty 2

## 2021-06-11 MED ORDER — SIMETHICONE 80 MG PO CHEW: 80.0000 mg | CHEWABLE_TABLET | ORAL | Status: DC | PRN

## 2021-06-11 MED ORDER — OXYCODONE HCL 5 MG PO TABS: 10.0000 mg | ORAL_TABLET | ORAL | Status: DC | PRN

## 2021-06-11 MED ORDER — OXYCODONE-ACETAMINOPHEN 5-325 MG PO TABS: 1.0000 | ORAL_TABLET | ORAL | Status: DC | PRN

## 2021-06-11 MED ORDER — LACTATED RINGERS IV SOLN
500.0000 mL | INTRAVENOUS | Status: DC | PRN
  Administered 2021-06-11: 1000 mL via INTRAVENOUS

## 2021-06-11 MED ORDER — LACTATED RINGERS IV SOLN: 500.0000 mL | Freq: Once | INTRAVENOUS | Status: DC

## 2021-06-11 MED ORDER — COCONUT OIL OIL: 1.0000 "application " | TOPICAL_OIL | Status: DC | PRN

## 2021-06-11 MED ORDER — BENZOCAINE-MENTHOL 20-0.5 % EX AERO
1.0000 "application " | INHALATION_SPRAY | CUTANEOUS | Status: DC | PRN
  Administered 2021-06-11 – 2021-06-13 (×2): 1 via TOPICAL
  Filled 2021-06-11 (×2): qty 56

## 2021-06-11 MED ORDER — OXYTOCIN-SODIUM CHLORIDE 30-0.9 UT/500ML-% IV SOLN
1.0000 m[IU]/min | INTRAVENOUS | Status: DC
  Administered 2021-06-11: 2 m[IU]/min via INTRAVENOUS

## 2021-06-11 MED ORDER — LACTATED RINGERS IV SOLN: INTRAVENOUS | Status: DC

## 2021-06-11 MED ORDER — IBUPROFEN 600 MG PO TABS
600.0000 mg | ORAL_TABLET | Freq: Four times a day (QID) | ORAL | Status: DC
  Administered 2021-06-11 – 2021-06-13 (×7): 600 mg via ORAL
  Filled 2021-06-11 (×8): qty 1

## 2021-06-11 MED ORDER — LIDOCAINE HCL (PF) 1 % IJ SOLN
INTRAMUSCULAR | Status: DC | PRN
  Administered 2021-06-11: 5 mL via EPIDURAL

## 2021-06-11 MED ORDER — EPHEDRINE 5 MG/ML INJ
10.0000 mg | INTRAVENOUS | Status: DC | PRN
  Filled 2021-06-11: qty 2

## 2021-06-11 MED ORDER — FENTANYL-BUPIVACAINE-NACL 0.5-0.125-0.9 MG/250ML-% EP SOLN
EPIDURAL | Status: DC | PRN
  Administered 2021-06-11: 12 mL/h via EPIDURAL

## 2021-06-11 MED ORDER — DIBUCAINE (PERIANAL) 1 % EX OINT: 1.0000 "application " | TOPICAL_OINTMENT | CUTANEOUS | Status: DC | PRN

## 2021-06-11 MED ORDER — SOD CITRATE-CITRIC ACID 500-334 MG/5ML PO SOLN: 30.0000 mL | ORAL | Status: DC | PRN

## 2021-06-11 MED ORDER — TERBUTALINE SULFATE 1 MG/ML IJ SOLN
0.2500 mg | Freq: Once | INTRAMUSCULAR | Status: DC | PRN
  Filled 2021-06-11: qty 1

## 2021-06-11 MED ORDER — PRENATAL MULTIVITAMIN CH
1.0000 | ORAL_TABLET | Freq: Every day | ORAL | Status: DC
  Administered 2021-06-12 – 2021-06-13 (×2): 1 via ORAL
  Filled 2021-06-11 (×2): qty 1

## 2021-06-11 MED ORDER — OXYTOCIN BOLUS FROM INFUSION
333.0000 mL | Freq: Once | INTRAVENOUS | Status: AC
  Administered 2021-06-11: 333 mL via INTRAVENOUS

## 2021-06-11 MED ORDER — PHENYLEPHRINE 40 MCG/ML (10ML) SYRINGE FOR IV PUSH (FOR BLOOD PRESSURE SUPPORT)
80.0000 ug | PREFILLED_SYRINGE | INTRAVENOUS | Status: DC | PRN
  Filled 2021-06-11 (×2): qty 10

## 2021-06-11 MED ORDER — ZOLPIDEM TARTRATE 5 MG PO TABS: 5.0000 mg | ORAL_TABLET | Freq: Every evening | ORAL | Status: DC | PRN

## 2021-06-11 MED ORDER — LIDOCAINE HCL (PF) 1 % IJ SOLN
30.0000 mL | INTRAMUSCULAR | Status: DC | PRN
  Filled 2021-06-11: qty 30

## 2021-06-11 MED ORDER — FENTANYL CITRATE (PF) 100 MCG/2ML IJ SOLN: 50.0000 ug | INTRAMUSCULAR | Status: DC | PRN

## 2021-06-11 NOTE — Progress Notes (Signed)
Lindsey Decker is a 25 y.o. G1P0 at [redacted]w[redacted]d admitted for spontaneous labor.  Subjective:  Received call from RN. SROM after epidural placement. POC discussed with pt. SVE 10/100/+1. Will labor down until pt feeling pressure or recheck in 1 hour.   Objective: BP (!) 113/57   Pulse 98   Temp 98.6 F (37 C) (Oral)   Resp 15   Ht 5\' 11"  (1.803 m)   Wt 86.2 kg   LMP 03/07/2020   SpO2 100%   BMI 26.50 kg/m  No intake/output data recorded. No intake/output data recorded.  FHT:  FHR: 115 bpm, variability: moderate,  accelerations:  Present,  decelerations:  Present early and occasional variable UC:   regular, every 3-4 minutes SVE:   Dilation: 10 Exam by:: [email or web address], CNM  Labs: Lab Results  Component Value Date   WBC 16.4 (H) 06/11/2021   HGB 10.4 (L) 06/11/2021   HCT 33.3 (L) 06/11/2021   MCV 81.2 06/11/2021   PLT 260 06/11/2021    Assessment / Plan: Spontaneous labor, progressing normally  Labor: Progressing normally Fetal Wellbeing:  Category I Pain Control:  Epidural I/D:   GBS negative Anticipated MOD:  NSVD Dr 06/13/2021 aware of pt status and POC  Lindsey Ober Samary Shatz MSN, CNM 06/11/2021, 9:06 AM

## 2021-06-11 NOTE — Lactation Note (Signed)
This note was copied from a baby's chart. Lactation Consultation Note  Patient Name: Lindsey Decker EQAST'M Date: 06/11/2021 Reason for consult: L&D Initial assessment;Primapara;1st time breastfeeding;Term;Breastfeeding assistance;RN request;Other (Comment);Mother's request (LC LD visit at 55 min PP / baby STS when LC arrived / LC offered to assist / mom receptive/ after several attempts and placed in laid back position still feeding at 12 m/ LD RN aware to put in the total time.) Age:68 mins .  Baby fed for total 24 mins ( LC completed the total time for feeding in the next space.  Mom aware she will be seen again on MBU until  Due to areolas being semi compressible - LC recommended to the [facility] RN when giving report to have the [facility] set up a hand pump for prepump to stretch the nipple / areola complex for a deeper latch and to prevent soreness.  Maternal Data Has patient been taught Hand Expression?: Yes Does the patient have breastfeeding experience prior to this delivery?: No  Feeding Mother's Current Feeding Choice: Breast Milk  LATCH Score Latch: Repeated attempts needed to sustain latch, nipple held in mouth throughout feeding, stimulation needed to elicit sucking reflex.  Audible Swallowing: Spontaneous and intermittent  Type of Nipple: Everted at rest and after stimulation (erect nipples/ semi compressible areolas / softened with reverses pressure and hand expressing)  Comfort (Breast/Nipple): Soft / non-tender  Hold (Positioning): Assistance needed to correctly position infant at breast and maintain latch.  LATCH Score: 8   Lactation Tools Discussed/Used    Interventions Interventions: Breast feeding basics reviewed;Assisted with latch;Skin to skin;Breast massage;Hand express;Reverse pressure;Breast compression;Support pillows;Adjust position;Position options;Expressed milk;Education  Discharge    Consult Status Consult Status: Follow-up from L&D Date:  06/11/21 Follow-up type: In-patient    [name] [name] 06/11/2021, 2:46 PM

## 2021-06-11 NOTE — Anesthesia Preprocedure Evaluation (Signed)
Anesthesia Evaluation  Patient identified by MRN, date of birth, ID band Patient awake    Reviewed: Allergy & Precautions, NPO status , Patient's Chart, lab work & pertinent test results  Airway Mallampati: II  TM Distance: >3 FB Neck ROM: Full    Dental no notable dental hx. (+) Teeth Intact, Dental Advisory Given   Pulmonary neg pulmonary ROS,    Pulmonary exam normal breath sounds clear to auscultation       Cardiovascular negative cardio ROS Normal cardiovascular exam Rhythm:Regular Rate:Normal     Neuro/Psych    GI/Hepatic negative GI ROS, Neg liver ROS,   Endo/Other    Renal/GU negative Renal ROS     Musculoskeletal   Abdominal   Peds  Hematology Lab Results      Component                Value               Date                      WBC                      16.4 (H)            06/11/2021                HGB                      10.4 (L)            06/11/2021                HCT                      33.3 (L)            06/11/2021                MCV                      81.2                06/11/2021                PLT                      260                 06/11/2021              Anesthesia Other Findings   Reproductive/Obstetrics (+) Pregnancy                             Anesthesia Physical Anesthesia Plan  ASA: 2  Anesthesia Plan: Epidural   Post-op Pain Management:    Induction:   PONV Risk Score and Plan:   Airway Management Planned:   Additional Equipment:   Intra-op Plan:   Post-operative Plan:   Informed Consent: I have reviewed the patients History and Physical, chart, labs and discussed the procedure including the risks, benefits and alternatives for the proposed anesthesia with the patient or authorized representative who has indicated his/her understanding and acceptance.       Plan Discussed with:   Anesthesia Plan Comments: (40.4 wk primagravida  for LEA)        Anesthesia Quick Evaluation

## 2021-06-11 NOTE — Anesthesia Procedure Notes (Addendum)
Epidural Patient location during procedure: OB Start time: 06/11/2021 8:20 AM End time: 06/11/2021 8:33 AM  Staffing Anesthesiologist: Trevor Iha, MD Performed: anesthesiologist   Preanesthetic Checklist Completed: patient identified, IV checked, site marked, risks and benefits discussed, surgical consent, monitors and equipment checked, pre-op evaluation and timeout performed  Epidural Patient position: sitting Prep: DuraPrep and site prepped and draped Patient monitoring: continuous pulse ox and blood pressure Approach: midline Location: L3-L4 Injection technique: LOR air  Needle:  Needle type: Tuohy  Needle gauge: 17 G Needle length: 9 cm and 9 Needle insertion depth: 6 cm Catheter type: closed end flexible Catheter size: 19 Gauge Catheter at skin depth: 12 cm Test dose: negative  Assessment Events: blood not aspirated, injection not painful, no injection resistance, no paresthesia and negative IV test  Additional Notes Patient identified. Risks/Benefits/Options discussed with patient including but not limited to bleeding, infection, nerve damage, paralysis, failed block, incomplete pain control, headache, blood pressure changes, nausea, vomiting, reactions to medication both or allergic, itching and postpartum back pain. Confirmed with bedside nurse the patient's most recent platelet count. Confirmed with patient that they are not currently taking any anticoagulation, have any bleeding history or any family history of bleeding disorders. Patient expressed understanding and wished to proceed. All questions were answered. Sterile technique was used throughout the entire procedure. Please see nursing notes for vital signs. Test dose was given through epidural needle and negative prior to continuing to dose epidural or start infusion. Warning signs of high block given to the patient including shortness of breath, tingling/numbness in hands, complete motor block, or any  concerning symptoms with instructions to call for help. Patient was given instructions on fall risk and not to get out of bed. All questions and concerns addressed with instructions to call with any issues.  1 Attempt (S) . Patient tolerated procedure well.

## 2021-06-11 NOTE — Lactation Note (Signed)
This note was copied from a baby's chart. Lactation Consultation Note  Patient Name: Lindsey Decker ZOXWR'U Date: 06/11/2021 Reason for consult: Initial assessment;Mother's request;Primapara;1st time breastfeeding;Term;Breastfeeding assistance Age:23 hours  Mom feeding plan to EBF. LC assisted with latching infant in football with signs of milk transfer, audible swallows heard.   Mom pre pump with manual pump before latching 5-10 min. Mom can also do a nipple role before latching.  Plan 1. To feed based on cues 8-12x 24hr period. Mom to offer breasts and look for signs of milk transfer.  2. If unable to latch, Mom to hand express and offer EBM via spoon 5-7 ml 3. I and O sheet reviewed.  All questions answered at the end of the visit.   Maternal Data Has patient been taught Hand Expression?: Yes  Feeding Mother's Current Feeding Choice: Breast Milk  LATCH Score Latch: Repeated attempts needed to sustain latch, nipple held in mouth throughout feeding, stimulation needed to elicit sucking reflex.  Audible Swallowing: Spontaneous and intermittent  Type of Nipple: Everted at rest and after stimulation  Comfort (Breast/Nipple): Soft / non-tender  Hold (Positioning): Assistance needed to correctly position infant at breast and maintain latch.  LATCH Score: 8   Lactation Tools Discussed/Used Tools: Pump;Flanges Flange Size: 24;21 Breast pump type: Manual Pump Education: Setup, frequency, and cleaning;Milk Storage Reason for Pumping: elongate her nipple Pumping frequency: pre pump 5-10 mins before latching  Interventions Interventions: Breast feeding basics reviewed;Adjust position;Assisted with latch;Support pillows;Skin to skin;Position options;Education;Breast massage;Expressed milk;Hand express;LC Services brochure;Pre-pump if needed;Infant Driven Feeding Algorithm education;Breast compression;Hand pump  Discharge WIC Program: Yes  Consult Status Consult Status:  Follow-up Date: 06/12/21 Follow-up type: In-patient    Keithon Mccoin  Nicholson-Springer 06/11/2021, 7:37 PM

## 2021-06-11 NOTE — H&P (Signed)
OB ADMISSION/ HISTORY & PHYSICAL:  Admission Date: 06/11/2021  6:49 AM  Admit Diagnosis: Normal Labor  Lindsey Decker is a 25 y.o. female G1P0 [redacted]w[redacted]d presenting for normal labor. Endorses active FM, denies LOF.  Bloody show noted in MAU. Ctx began on 06/09/21 @ 1700.  History of current pregnancy: G1P0   Primary OB Provider: [facility] Patient entered care with [facility] at 14.5 wks.   EDC 06/07/21 by LMP 08/31/20 and congruent w/ 14.5 wk U/S.   Anatomy scan:  19.6 wks, complete w/ Fundal placenta.   Last evaluation: 40.2  wks  EFW @ 36.2= 6lbs 4 oz, 46% Significant prenatal events:   Patient Active Problem List   Diagnosis Date Noted   Vitamin D deficiency, unspecified 06/26/2019   Underweight 06/26/2019    Prenatal Labs: ABO, Rh: A/Positive/-- (06/02 0000) Antibody: Negative (06/02 0000) Rubella: Immune (06/02 0000)   RPR:   Negative HBsAg: Negative (06/02 0000)   HIV: Non-reactive (06/02 0000)   GTT: 79 GBS: Negative GC/CHL: Negative/Negative Genetics: Low risk, AFP negative Tdap/influenza vaccines: UTD   OB History  Gravida Para Term Preterm AB Living  1            SAB IAB Ectopic Multiple Live Births               # Outcome Date GA Lbr Len/2nd Weight Sex Delivery Anes PTL Lv  1 Current             Medical / Surgical History: Past medical history:  Past Medical History:  Diagnosis Date   Depression     Past surgical history:  Past Surgical History:  Procedure Laterality Date   NO PAST SURGERIES     Family History: History reviewed. No pertinent family history.  Social History:  reports that she has never smoked. She has never used smokeless tobacco. She reports that she does not drink alcohol and does not use drugs.  Allergies: Patient has no known allergies.   Current Medications at time of admission:  Prior to Admission medications   Medication Sig Start Date End Date Taking? Authorizing Provider  mirtazapine (REMERON) 7.5 MG tablet Take 7.5 mg by  mouth at bedtime.   Yes [provider]  Prenatal Vit-Fe Fumarate-FA (MULTIVITAMIN-PRENATAL) 27-0.8 MG TABS tablet Take 1 tablet by mouth daily at 12 noon.   Yes [provider]  Vitamin D, Ergocalciferol, (DRISDOL) 1.25 MG (50000 UNIT) CAPS capsule Take 50,000 Units by mouth once.    [provider]    Review of Systems: Constitutional: Negative   HENT: Negative   Eyes: Negative   Respiratory: Negative   Cardiovascular: Negative   Gastrointestinal: Negative  Genitourinary: positve for bloody show, negative for LOF   Musculoskeletal: Negative   Skin: Negative   Neurological: Negative   Endo/Heme/Allergies: Negative   Psychiatric/Behavioral: Negative    Physical Exam: VS: Blood pressure 120/66, pulse 96, temperature 98.6 F (37 C), temperature source Oral, resp. rate 15, last menstrual period 03/07/2020, SpO2 99 %. AAO x3, no signs of distress Cardiovascular: RRR Respiratory: Lung fields clear to ausculation GU/GI: Abdomen gravid, non-tender, non-distended, active FM, vertex Extremities: negative edema, negative for pain, tenderness, and cords  Cervical exam:Dilation: 5.5 Exam by:: Holly Flippin RN FHR: baseline rate 120 / variability moderate / accelerations present / absent decelerations TOCO: 1-5   Prenatal Transfer Tool  Maternal Diabetes: No Genetic Screening: Normal Maternal Ultrasounds/Referrals: Normal Fetal Ultrasounds or other Referrals:  None Maternal Substance Abuse:  No Significant Maternal  Medications:  None Significant Maternal Lab Results: Group B Strep negative    Assessment: 25 y.o. G1P0 [redacted]w[redacted]d admitted for spontaneous labor  latent stage of labor advancing to active phase FHR category 1 GBS negative Pain management plan: Epidural PRN   Plan:  Admit to L&D Routine admission orders Epidural PRN   Dr Sallye Ober notified of admission and plan of care  Carollee Leitz MSN, CNM 06/11/2021 7:25 AM

## 2021-06-11 NOTE — MAU Note (Signed)
.  Lindsey Decker is a 25 y.o. at [redacted]w[redacted]d here in MAU reporting: ctx increasing in intensity and frequency. States they are 5 minutes apart. Reporting some bloody show. Endorses good fetal movement. No LOF.   Pain score: 10

## 2021-06-12 LAB — CBC
HCT: 29.9 % — ABNORMAL LOW (ref 36.0–46.0)
Hemoglobin: 9.5 g/dL — ABNORMAL LOW (ref 12.0–15.0)
MCH: 25.8 pg — ABNORMAL LOW (ref 26.0–34.0)
MCHC: 31.8 g/dL (ref 30.0–36.0)
MCV: 81.3 fL (ref 80.0–100.0)
Platelets: 244 10*3/uL (ref 150–400)
RBC: 3.68 MIL/uL — ABNORMAL LOW (ref 3.87–5.11)
RDW: 15.1 % (ref 11.5–15.5)
WBC: 16.6 10*3/uL — ABNORMAL HIGH (ref 4.0–10.5)
nRBC: 0 % (ref 0.0–0.2)

## 2021-06-12 NOTE — Anesthesia Postprocedure Evaluation (Signed)
Anesthesia Post Note  Patient: Lindsey Decker  Procedure(s) Performed: AN AD HOC LABOR EPIDURAL     Patient location during evaluation: Mother Baby Anesthesia Type: Epidural Level of consciousness: awake Pain management: satisfactory to patient Vital Signs Assessment: post-procedure vital signs reviewed and stable Respiratory status: spontaneous breathing Cardiovascular status: stable Anesthetic complications: no   No notable events documented.  Last Vitals:  Vitals:   06/12/21 0020 06/12/21 0437  BP: 105/62 102/63  Pulse: 88 82  Resp: 16 18  Temp: 36.9 C 36.4 C  SpO2: 100%     Last Pain:  Vitals:   06/12/21 0437  TempSrc: Oral  PainSc: 0-No pain   Pain Goal:                   [employer]

## 2021-06-12 NOTE — Progress Notes (Signed)
MD PROGRESS NOTE  Lindsey Decker [id number]  PPD# 1 SVD w/ Lindsey Decker Information for the patient's newborn:  Lindsey, Decker Girl Lindsey Decker [[id number]]  female    S:   Reports feeling good, no complaints Tolerating PO fluid and solids No nausea or vomiting Bleeding is light Pain controlled with acetaminophen and ibuprofen (OTC) Up ad lib / ambulatory / voiding w/o difficulty Feeding: Breast    O:   VS: BP 113/64   Pulse 70   Temp 97.8 F (36.6 C) (Oral)   Resp 16   Ht 5\' 11"  (1.803 m)   Wt 86.2 kg   LMP 03/07/2020   SpO2 96%   Breastfeeding Unknown   BMI 26.50 kg/m   LABS:  Recent Labs    06/11/21 0720 06/12/21 0601  WBC 16.4* 16.6*  HGB 10.4* 9.5*  PLT 260 244   Blood type: --/--/A POS (11/30 0727) Rubella: Immune (06/02 0000)                      I&O: Intake/Output      11/30 0701 12/01 0700 12/01 0701 12/02 0700   Urine (mL/kg/hr) 1400 (0.7)    Blood 150    Total Output 1550    Net -1550           Physical Exam: Alert and oriented X3 Lungs: Clear and unlabored Heart: regular rate and rhythm / no mumurs Abdomen: soft, non-tender, non-distended  Fundus: firm, non-tender, U-1 Perineum: healing Lochia: minimal Extremities: No edema, no calf pain, tenderness, or cords    Assessment:  PPD # 1 patient doing well  Normal exam  Plan:  Continue routine post partum care Lactation consultation Anticipate D/C on 06/13/21   14/2/22, MD 06/12/2021, 5:39 PM

## 2021-06-12 NOTE — Progress Notes (Signed)
MOB was referred for history of depression/anxiety. * Referral screened out by Clinical Social Worker because none of the following criteria appear to apply: ~ History of anxiety/depression during this pregnancy, or of post-partum depression. ~ Diagnosis of anxiety and/or depression within last 3 years OR * MOB's symptoms currently being treated with medication and/or therapy. MOB has an active Rx for Mirtazipine  Edinburgh Score is 0.  Please contact the Clinical Social Worker if needs arise, or if MOB requests.  Blaine Hamper, MSW, LCSW Clinical Social Work [phone]

## 2021-06-13 MED ORDER — ACETAMINOPHEN 325 MG PO TABS: 650.0000 mg | ORAL_TABLET | ORAL | Status: DC | PRN

## 2021-06-13 MED ORDER — IBUPROFEN 600 MG PO TABS: 600.0000 mg | ORAL_TABLET | Freq: Four times a day (QID) | ORAL | 0 refills | Status: DC

## 2021-06-13 NOTE — Discharge Summary (Signed)
Postpartum Discharge Summary  Date of Service updated 06/13/21     Patient Name: Lindsey Decker DOB: 24-Nov-1995 MRN: [id number]  Date of admission: 06/11/2021 Delivery date:06/11/2021  Delivering provider: Holli Humbles B  Date of discharge: 06/13/2021  Admitting diagnosis: Normal labor and delivery [O80] Intrauterine pregnancy: [redacted]w[redacted]d    Secondary diagnosis:  Principal Problem:   Normal labor and delivery  Additional problems:     Discharge diagnosis: Term Pregnancy Delivered                                              Post partum procedures: none Augmentation: AROM and Pitocin Complications: None  Hospital course: Onset of Labor With Vaginal Delivery      25y.o. yo G1P1001 at 457w4das admitted in Latent Labor on 06/11/2021. Patient had an uncomplicated labor course as follows:  Membrane Rupture Time/Date: 8:21 AM ,06/11/2021   Delivery Method:Vaginal, Spontaneous  Episiotomy: None  Lacerations:  2nd degree;Perineal  Patient had an uncomplicated postpartum course.  She is ambulating, tolerating a regular diet, passing flatus, and urinating well. Patient is discharged home in stable condition on 06/13/21.  Newborn Data: Birth date:06/11/2021  Birth time:1:24 PM  Gender:Female  Living status:Living  Apgars:9 ,9  Weight:2800 g   Magnesium Sulfate received: No BMZ received: No Rhophylac:N/A MMR:N/A T-DaP:Given prenatally Flu: Yes Transfusion:No  Physical exam  Vitals:   06/12/21 0437 06/12/21 1450 06/12/21 2218 06/13/21 0634  BP: 102/63 113/64 108/66 104/76  Pulse: 82 70 87 91  Resp: _0 Temp: 97.6 F (36.4 C) 97.8 F (36.6 C) 98.1 F (36.7 C) 98.1 F (36.7 C)  TempSrc: Oral Oral Oral Oral  SpO2:  96% 100% 100%  Weight:      Height:       General: alert, cooperative, and no distress Lochia: appropriate Uterine Fundus: firm Incision: N/A DVT Evaluation: No evidence of DVT seen on physical exam. No cords or calf  tenderness. No significant calf/ankle edema. Labs: Lab Results  Component Value Date   WBC 16.6 (H) 06/12/2021   HGB 9.5 (L) 06/12/2021   HCT 29.9 (L) 06/12/2021   MCV 81.3 06/12/2021   PLT 244 06/12/2021   CMP Latest Ref Rng & Units 06/26/2019  Glucose 70 - 99 mg/dL 89  BUN 6 - 23 mg/dL 10  Creatinine 0.40 - 1.20 mg/dL 0.62  Sodium 135 - 145 mEq/L 138  Potassium 3.5 - 5.1 mEq/L 4.1  Chloride 96 - 112 mEq/L 105  CO2 19 - 32 mEq/L 24  Calcium 8.4 - 10.5 mg/dL 9.3   Edinburgh Score: Edinburgh Postnatal Depression Scale Screening Tool 06/12/2021  I have been able to laugh and see the funny side of things. 0  I have looked forward with enjoyment to things. 0  I have blamed myself unnecessarily when things went wrong. 0  I have been anxious or worried for no good reason. 0  I have felt scared or panicky for no good reason. 0  Things have been getting on top of me. 0  I have been so unhappy that I have had difficulty sleeping. 0  I have felt sad or miserable. 0  I have been so unhappy that I have been crying. 0  The thought of harming myself has occurred to me. 0  Edinburgh Postnatal Depression Scale Total 0  After visit meds:  Allergies as of 06/13/2021   No Known Allergies      Medication List     TAKE these medications    acetaminophen 325 MG tablet Commonly known as: Tylenol Take 2 tablets (650 mg total) by mouth every 4 (four) hours as needed (for pain scale < 4).   ibuprofen 600 MG tablet Commonly known as: ADVIL Take 1 tablet (600 mg total) by mouth every 6 (six) hours.   mirtazapine 7.5 MG tablet Commonly known as: REMERON Take 7.5 mg by mouth at bedtime.   multivitamin-prenatal 27-0.8 MG Tabs tablet Take 1 tablet by mouth daily at 12 noon.   Vitamin D (Ergocalciferol) 1.25 MG (50000 UNIT) Caps capsule Commonly known as: DRISDOL Take 50,000 Units by mouth once.         Discharge home in stable condition Infant Feeding: Breast Infant  Disposition:home with mother Discharge instruction: per After Visit Summary and Postpartum booklet. Activity: Advance as tolerated. Pelvic rest for 6 weeks.  Diet: routine diet Anticipated Birth Control: Unsure and education materials provided Postpartum Appointment:6 weeks Additional Postpartum F/U:  mood evaluation in 1 week Future Appointments:No future appointments. Follow up Visit:  [facility]. Go in 1 week(s).   Specialty: Obstetrics and Gynecology Why: You will come to [facility] in 1 week for a mood evaluation and then return in 6 weeks for your regular postpartum visit. Contact information: [address] [place] [id number] [phone]                    06/13/2021 Arrie Eastern, CNM

## 2021-06-13 NOTE — Lactation Note (Signed)
This note was copied from a baby's chart. Lactation Consultation Note  Patient Name: Lindsey Decker [id number] Date: 06/13/2021 Reason for consult: Follow-up assessment;Primapara;1st time breastfeeding;Term;Infant weight loss;Other (Comment) (6 % weight loss / milk is in) Age:25 years Baby awake and hungry when LC entered.  Per mom had spoon fed baby 20 ml of EBM . Per mom had  Pumped off 45 ml and  milk is in.  Baby still routing after being fed / LC offered to assist with the football position/  Depth obtained/ baby still feeding at 23 mins with multiple swallows.  Latch score 8.  LC reviewed Breast feeding D/C teaching.  Mom asked for if her anti- depressant med was ok with breastfeeding.  LC checked the [facility] Medications and mothers milk - Mirtazapine - L3 . LC reassured mom and dad if mom was taking medication during her pregnancy should be ok. Mom is on a low dose.    Maternal Data Has patient been taught Hand Expression?: Yes  Feeding Mother's Current Feeding Choice: Breast Milk  LATCH Score Latch: Grasps breast easily, tongue down, lips flanged, rhythmical sucking.  Audible Swallowing: Spontaneous and intermittent  Type of Nipple: Everted at rest and after stimulation  Comfort (Breast/Nipple): Filling, red/small blisters or bruises, mild/mod discomfort  Hold (Positioning): Assistance needed to correctly position infant at breast and maintain latch.  LATCH Score: 8   Lactation Tools Discussed/Used Tools: Pump;Flanges Flange Size: 21;24 Breast pump type: Manual Pump Education: Milk Storage Reason for Pumping: PRN  Interventions Interventions: Breast feeding basics reviewed;Assisted with latch;Skin to skin;Breast massage;Hand express;Breast compression;Adjust position;Support pillows;Position options;Hand pump;Shells;Education;LC Services brochure  Discharge Discharge Education: Engorgement and breast care;Warning signs for feeding baby Pump:  Personal;Manual;DEBP  Consult Status Consult Status: Complete Date: 06/13/21    Kathrin Greathouse 06/13/2021, 2:54 PM

## 2021-06-14 ENCOUNTER — Inpatient Hospital Stay (HOSPITAL_COMMUNITY): Payer: Medicaid Other

## 2021-06-14 ENCOUNTER — Inpatient Hospital Stay (HOSPITAL_COMMUNITY)
Admission: AD | Admit: 2021-06-14 | Payer: Medicaid Other | Source: Home / Self Care | Admitting: Obstetrics and Gynecology

## 2021-06-24 ENCOUNTER — Telehealth (HOSPITAL_COMMUNITY): Payer: Self-pay | Admitting: *Deleted

## 2021-06-24 NOTE — Telephone Encounter (Signed)
Attempted Hospital Discharge Follow-Up Call.  Left voice mail requesting that patient return RN's phone call.  

## 2021-06-26 DIAGNOSIS — F331 Major depressive disorder, recurrent, moderate: Secondary | ICD-10-CM | POA: Diagnosis not present

## 2021-06-26 DIAGNOSIS — F410 Panic disorder [episodic paroxysmal anxiety] without agoraphobia: Secondary | ICD-10-CM | POA: Diagnosis not present

## 2021-07-22 DIAGNOSIS — Z308 Encounter for other contraceptive management: Secondary | ICD-10-CM | POA: Diagnosis not present

## 2021-11-24 ENCOUNTER — Encounter: Payer: Self-pay | Admitting: Physician Assistant

## 2021-11-30 ENCOUNTER — Other Ambulatory Visit: Payer: Self-pay

## 2021-11-30 ENCOUNTER — Encounter (HOSPITAL_COMMUNITY): Payer: Self-pay | Admitting: Emergency Medicine

## 2021-11-30 ENCOUNTER — Emergency Department (HOSPITAL_COMMUNITY)
Admission: EM | Admit: 2021-11-30 | Discharge: 2021-11-30 | Disposition: A | Discharge status: Home / Self Care | Payer: Medicaid Other | Attending: Emergency Medicine | Admitting: Emergency Medicine

## 2021-11-30 ENCOUNTER — Emergency Department (HOSPITAL_COMMUNITY): Payer: Medicaid Other

## 2021-11-30 DIAGNOSIS — R0789 Other chest pain: Secondary | ICD-10-CM | POA: Diagnosis not present

## 2021-11-30 DIAGNOSIS — R079 Chest pain, unspecified: Secondary | ICD-10-CM | POA: Diagnosis not present

## 2021-11-30 DIAGNOSIS — E876 Hypokalemia: Secondary | ICD-10-CM | POA: Insufficient documentation

## 2021-11-30 DIAGNOSIS — F32A Depression, unspecified: Secondary | ICD-10-CM | POA: Insufficient documentation

## 2021-11-30 LAB — CBC
HCT: 39.3 % (ref 36.0–46.0)
Hemoglobin: 13.1 g/dL (ref 12.0–15.0)
MCH: 28 pg (ref 26.0–34.0)
MCHC: 33.3 g/dL (ref 30.0–36.0)
MCV: 84 fL (ref 80.0–100.0)
Platelets: 295 10*3/uL (ref 150–400)
RBC: 4.68 MIL/uL (ref 3.87–5.11)
RDW: 14 % (ref 11.5–15.5)
WBC: 6.6 10*3/uL (ref 4.0–10.5)
nRBC: 0 % (ref 0.0–0.2)

## 2021-11-30 LAB — BASIC METABOLIC PANEL
Anion gap: 7 (ref 5–15)
BUN: 10 mg/dL (ref 6–20)
CO2: 22 mmol/L (ref 22–32)
Calcium: 8.8 mg/dL — ABNORMAL LOW (ref 8.9–10.3)
Chloride: 111 mmol/L (ref 98–111)
Creatinine, Ser: 0.58 mg/dL (ref 0.44–1.00)
GFR, Estimated: >60 mL/min (ref >60)
Glucose, Bld: 104 mg/dL — ABNORMAL HIGH (ref 70–99)
Potassium: 3.3 mmol/L — ABNORMAL LOW (ref 3.5–5.1)
Sodium: 140 mmol/L (ref 135–145)

## 2021-11-30 LAB — I-STAT BETA HCG BLOOD, ED (MC, WL, AP ONLY): I-stat hCG, quantitative: <5 m[IU]/mL (ref <5)

## 2021-11-30 LAB — TROPONIN I (HIGH SENSITIVITY): Troponin I (High Sensitivity): <2 ng/L (ref <18)

## 2021-11-30 NOTE — ED Triage Notes (Signed)
Pt presents with reports of her "heart racing" and a "feeling in her chest" that she cannot describe.  States she feels shob.  Was lying in bed about to go to sleep when this occurred.

## 2021-11-30 NOTE — ED Provider Notes (Signed)
[facility]-EMERGENCY DEPT [facility] Emergency Department Provider Note MRN:  [id number]  Arrival date & time: 11/30/21     Chief Complaint   Chest Pain   History of Present Illness   Lindsey Decker is a 26 y.o. year-old female presents to the ED with chief complaint of chest pain and heart racing sensation that started tonight before bed.  She states that she came off of her depression medication about 1 month ago.  She states that she does not necessarily feel anxious or stressed.  She denies any heart problems.  She denies cough or fever.  Denies any treatment prior to arrival.  Denies any leg swelling.  History provided by patient.   Review of Systems  Pertinent review of systems noted in HPI.    Physical Exam   Vitals:   11/30/21 0230 11/30/21 0300  BP: 110/66 101/65  Pulse: 62 60  Resp:    Temp:    SpO2: 100% 100%    CONSTITUTIONAL:  well-appearing, NAD NEURO:  Alert and oriented x 3, CN 3-12 grossly intact EYES:  eyes equal and reactive ENT/NECK:  Supple, no stridor  CARDIO:  normal rate, regular rhythm, appears well-perfused  PULM:  No respiratory distress, CTAB GI/GU:  non-distended,  MSK/SPINE:  No gross deformities, no edema, moves all extremities  SKIN:  no rash, atraumatic   *Additional and/or pertinent findings included in MDM below  Diagnostic and Interventional Summary    EKG Interpretation  Date/Time:  Sunday Nov 30 2021 01:36:03 EDT Ventricular Rate:  69 PR Interval:  137 QRS Duration: 92 QT Interval:  380 QTC Calculation: 408 R Axis:   85 Text Interpretation: Sinus rhythm Borderline T abnormalities, anterior leads Confirmed by Geoffery Lyons ([id number]) on 11/30/2021 2:08:22 AM       Labs Reviewed  BASIC METABOLIC PANEL - Abnormal; Notable for the following components:      Result Value   Potassium 3.3 (*)    Glucose, Bld 104 (*)    Calcium 8.8 (*)    All other components within normal limits  CBC  I-STAT BETA HCG BLOOD, ED  ([facility], [facility], [facility] ONLY)  TROPONIN I (HIGH SENSITIVITY)    DG Chest 2 View  Final Result      Medications - No data to display   Procedures  /  Critical Care Procedures  ED Course and Medical Decision Making  I have reviewed the triage vital signs, the nursing notes, and pertinent available records from the EMR.  Social Determinants Affecting Complexity of Care: Patient has no clinically significant social determinants affecting this chief complaint..   ED Course:   Patient here with chest pain.  Top differential diagnoses include anxiety reaction, PE, pneumonia. Medical Decision Making Patient here with chest pain that she describes as racing heart.  Started tonight when she was lying in bed.  Normal HR.  Labs, imaging and EKG are reassuring as noted.  Doubt ACS or PE.  Consider anxiety?  Patient appears stable for discharge from ED.  Problems Addressed: Nonspecific chest pain: undiagnosed new problem with uncertain prognosis  Amount and/or Complexity of Data Reviewed Labs: ordered.    Details: Trop is 2, low risk for ACS.  Mild hypokalemia of 3.3. Radiology: ordered and independent interpretation performed.    Details: no opacity or effusion ECG/medicine tests: ordered and independent interpretation performed.    Details: No ischemic changes     Consultants: No consultations were needed in caring for this patient.   Treatment and Plan:  Emergency department workup does not suggest an emergent condition requiring admission or immediate intervention beyond  what has been performed at this time. The patient is safe for discharge and has  been instructed to return immediately for worsening symptoms, change in  symptoms or any other concerns    Final Clinical Impressions(s) / ED Diagnoses     ICD-10-CM   1. Nonspecific chest pain  R07.9       ED Discharge Orders     None         Discharge Instructions Discussed with and Provided to Patient:   Discharge Instructions    None      Montine Circle, PA-C 11/30/21 [id number]    Veryl Speak, MD 11/30/21 [phone]

## 2021-12-01 ENCOUNTER — Telehealth: Payer: Self-pay

## 2021-12-01 NOTE — Telephone Encounter (Signed)
Transition Care Management Unsuccessful Follow-up Telephone Call  Date of discharge and from where:  11/30/2021 from [facility]  Attempts:  1st Attempt  Reason for unsuccessful TCM follow-up call:  Left voice message

## 2021-12-02 NOTE — Telephone Encounter (Signed)
Transition Care Management Unsuccessful Follow-up Telephone Call  Date of discharge and from where:  11/30/2021 from [facility]  Attempts:  2nd Attempt  Reason for unsuccessful TCM follow-up call:  Left voice message

## 2021-12-03 NOTE — Telephone Encounter (Signed)
Transition Care Management Unsuccessful Follow-up Telephone Call  Date of discharge and from where:  11/30/2021 from [facility]  Attempts:  3rd Attempt  Reason for unsuccessful TCM follow-up call:  Unable to reach patient

## 2021-12-18 ENCOUNTER — Ambulatory Visit: Payer: Medicaid Other | Admitting: Physician Assistant

## 2021-12-31 ENCOUNTER — Encounter: Payer: Self-pay | Admitting: Gastroenterology

## 2021-12-31 ENCOUNTER — Other Ambulatory Visit (INDEPENDENT_AMBULATORY_CARE_PROVIDER_SITE_OTHER): Payer: Medicaid Other

## 2021-12-31 ENCOUNTER — Ambulatory Visit (INDEPENDENT_AMBULATORY_CARE_PROVIDER_SITE_OTHER): Payer: Medicaid Other | Admitting: Gastroenterology

## 2021-12-31 VITALS — BP 110/64 | HR 74 | Ht 71.0 in | Wt 145.8 lb

## 2021-12-31 DIAGNOSIS — R630 Anorexia: Secondary | ICD-10-CM

## 2021-12-31 DIAGNOSIS — R634 Abnormal weight loss: Secondary | ICD-10-CM | POA: Diagnosis not present

## 2021-12-31 LAB — CBC WITH DIFFERENTIAL/PLATELET
Basophils Absolute: 0.1 10*3/uL (ref 0.0–0.1)
Basophils Relative: 0.6 % (ref 0.0–3.0)
Eosinophils Absolute: 0.4 10*3/uL (ref 0.0–0.7)
Eosinophils Relative: 4.6 % (ref 0.0–5.0)
HCT: 38.9 % (ref 36.0–46.0)
Hemoglobin: 12.8 g/dL (ref 12.0–15.0)
Lymphocytes Relative: 31.9 % (ref 12.0–46.0)
Lymphs Abs: 2.7 10*3/uL (ref 0.7–4.0)
MCHC: 32.8 g/dL (ref 30.0–36.0)
MCV: 83.7 fl (ref 78.0–100.0)
Monocytes Absolute: 0.6 10*3/uL (ref 0.1–1.0)
Monocytes Relative: 6.6 % (ref 3.0–12.0)
Neutro Abs: 4.7 10*3/uL (ref 1.4–7.7)
Neutrophils Relative %: 56.3 % (ref 43.0–77.0)
Platelets: 287 10*3/uL (ref 150.0–400.0)
RBC: 4.65 Mil/uL (ref 3.87–5.11)
RDW: 15 % (ref 11.5–15.5)
WBC: 8.4 10*3/uL (ref 4.0–10.5)

## 2021-12-31 LAB — COMPREHENSIVE METABOLIC PANEL
ALT: 7 U/L (ref 0–35)
AST: 15 U/L (ref 0–37)
Albumin: 4 g/dL (ref 3.5–5.2)
Alkaline Phosphatase: 67 U/L (ref 39–117)
BUN: 13 mg/dL (ref 6–23)
CO2: 28 mEq/L (ref 19–32)
Calcium: 9.7 mg/dL (ref 8.4–10.5)
Chloride: 104 mEq/L (ref 96–112)
Creatinine, Ser: 0.6 mg/dL (ref 0.40–1.20)
GFR: 125.88 mL/min (ref >60.00)
Glucose, Bld: 91 mg/dL (ref 70–99)
Potassium: 3.5 mEq/L (ref 3.5–5.1)
Sodium: 138 mEq/L (ref 135–145)
Total Bilirubin: 0.4 mg/dL (ref 0.2–1.2)
Total Protein: 7.3 g/dL (ref 6.0–8.3)

## 2021-12-31 LAB — TSH: TSH: 1.29 u[IU]/mL (ref 0.35–5.50)

## 2021-12-31 NOTE — Progress Notes (Unsigned)
Referring Provider: No ref. provider found Primary Care Physician:  Pcp, No   Reason for Consultation:  Weight loss   IMPRESSION:  Anorexia with unexplained 30 pound weight loss without localizing GI symptoms   PLAN: - CMP, CBC, TSH - H pylori stool antigen - CT abd/pelvis if labs are normal - Establish care with PCP to evaluate for nonGI causes of anorexia and weight loss - She will continue to take her Remeron while we are investigating her symptoms although she does not want to be on this in the long term   HPI: Lindsey Decker is a 26 y.o. female who presents for evaluation of weight loss and abdominal pain. She has a history of anxiety and depression.  She has always been thin and normally only eats one meal daily.  PCP treated her depression with a medication that increased her appetite. She finds it hard to eat but on further questioning it sounds like she just doesn't want to eat. She was able to discontinue to take the depression while seeing a therapist but has not followed up with her doctor.  She had lost 30 pounds over the last 2 months since losing her appetite.  She resumed her antidepressant last month and has started to regain weight.  She does not feel that the anorexia is related to anxiety or depression. She is unable to tell me who that is.   She has some heartburn but she does not use medications.  Most frequently occurs in the morning but has not occurred recently. No dysphagia, odynophagia.   ED evaluation 11/30/2021 for chest pain. CXR was normal.   Labs 11/30/21 showed normal BMP except for a glucose of 104, normal CBC.    No prior abdominal imaging  No prior endoscopic evaluation  There is no known family history of colon cancer or polyps. No family history of stomach cancer or other GI malignancy. No family history of inflammatory bowel disease or celiac.    Past Medical History:  Diagnosis Date   Depression     Past Surgical  History:  Procedure Laterality Date   NO PAST SURGERIES      Current Outpatient Medications  Medication Sig Dispense Refill   acetaminophen (TYLENOL) 325 MG tablet Take 2 tablets (650 mg total) by mouth every 4 (four) hours as needed (for pain scale < 4).     ibuprofen (ADVIL) 600 MG tablet Take 1 tablet (600 mg total) by mouth every 6 (six) hours. 30 tablet 0   mirtazapine (REMERON) 7.5 MG tablet Take 7.5 mg by mouth at bedtime.     Prenatal Vit-Fe Fumarate-FA (MULTIVITAMIN-PRENATAL) 27-0.8 MG TABS tablet Take 1 tablet by mouth daily at 12 noon.     Vitamin D, Ergocalciferol, (DRISDOL) 1.25 MG (50000 UNIT) CAPS capsule Take 50,000 Units by mouth once.     No current facility-administered medications for this visit.    Allergies as of 12/31/2021   (No Known Allergies)    No family history on file.  Social History   Socioeconomic History   Marital status: Married    Spouse name: Not on file   Number of children: Not on file   Years of education: Not on file   Highest education level: Not on file  Occupational History   Not on file  Tobacco Use   Smoking status: Never   Smokeless tobacco: Never  Vaping Use   Vaping Use: Never used  Substance and Sexual Activity   Alcohol use: Never  Drug use: Never   Sexual activity: Never  Other Topics Concern   Not on file  Social History Narrative   Not on file   Social Determinants of Health   Financial Resource Strain: Not on file  Food Insecurity: Not on file  Transportation Needs: Not on file  Physical Activity: Not on file  Stress: Not on file  Social Connections: Not on file  Intimate Partner Violence: Not on file    Review of Systems: 12 system ROS is negative except as noted above.   Physical Exam: General:   Alert,  well-nourished, pleasant and cooperative in NAD Head:  Normocephalic and atraumatic. Eyes:  Sclera clear, no icterus.   Conjunctiva pink. Ears:  Normal auditory acuity. Nose:  No deformity,  discharge,  or lesions. Mouth:  No deformity or lesions.   Neck:  Supple; no masses or thyromegaly. Lungs:  Clear throughout to auscultation.   No wheezes. Heart:  Regular rate and rhythm; no murmurs. Abdomen:  Soft, nontender, nondistended, normal bowel sounds, no rebound or guarding. No hepatosplenomegaly.   Rectal:  Deferred  Msk:  Symmetrical. No boney deformities LAD: No inguinal or umbilical LAD Extremities:  No clubbing or edema. Neurologic:  Alert and  oriented x4;  grossly nonfocal Skin:  Intact without significant lesions or rashes. Psych:  Alert and cooperative. Normal mood and affect.    Lindsey Decker L. Orvan Falconer, MD, MPH 12/31/2021, 8:24 AM

## 2021-12-31 NOTE — Patient Instructions (Signed)
It was my pleasure to provide care to you today. Based on our discussion, I am providing you with my recommendations below:  RECOMMENDATION(S):  Referral has been placed for Primary Care Physician. Someone from their office will contact you with an appointment.   You have been scheduled for a CT scan of the abdomen and pelvis at [facility], 1st floor Radiology. You are scheduled on 01/09/22  at 10:30am. You should arrive 15 minutes prior to your appointment time for registration.   The solution may taste better if refrigerated, but do NOT add ice or any other liquid to this solution. Shake well before drinking.   Please follow the written instructions below on the day of your exam:   1) Do not eat anything after 6:30am (4 hours prior to your test)   2) Drink 1 bottle of contrast @ 8:30am (2 hours prior to your exam)  Remember to shake well before drinking and do NOT pour over ice.     Drink 1 bottle of contrast @ 9:30am (1 hour prior to your exam)   You may take any medications as prescribed with a small amount of water, if necessary. If you take any of the following medications: METFORMIN, GLUCOPHAGE, GLUCOVANCE, AVANDAMET, RIOMET, FORTAMET, [place] MET, JANUMET, GLUMETZA or METAGLIP, you MAY be asked to HOLD this medication 48 hours AFTER the exam.   The purpose of you drinking the oral contrast is to aid in the visualization of your intestinal tract. The contrast solution may cause some diarrhea. Depending on your individual set of symptoms, you may also receive an intravenous injection of x-ray contrast/dye. Plan on being at [facility] for 45 minutes or longer, depending on the type of exam you are having performed.   If you have any questions regarding your exam or if you need to reschedule, you may call [facility] at [phone] between the hours of 8:00 am and 5:00 pm, Monday-Friday.     LABS:   Please proceed to the basement level for lab work before leaving  today. Press "B" on the elevator. The lab is located at the first door on the left as you exit the elevator.  HEALTHCARE LAWS AND MY CHART RESULTS:   Due to recent changes in healthcare laws, you may see results of your imaging and/or laboratory studies on MyChart before I have had a chance to review them.  I understand that in some cases there may be results that are confusing or concerning to you. Please understand that not all results are received at the same time and often I may need to interpret multiple results in order to provide you with the best plan of care or course of treatment. Therefore, I ask that you please give me 48 hours to thoroughly review all your results before contacting my office for clarification.    BMI:  If you are age 74 or older, your body mass index should be between 23-30. Your Body mass index is 20.33 kg/m. If this is out of the aforementioned range listed, please consider follow up with your Primary Care Provider.  If you are age 78 or younger, your body mass index should be between 19-25. Your Body mass index is 20.33 kg/m. If this is out of the aformentioned range listed, please consider follow up with your Primary Care Provider.   MY CHART:  The [facility] providers would like to encourage you to use [facility] to communicate with providers for non-urgent requests or questions.  Due to long hold times on the telephone, sending your provider a message by [facility] may be a faster and more efficient way to get a response.  Please allow 48 business hours for a response.  Please remember that this is for non-urgent requests.   Thank you for trusting me with your gastrointestinal care!    Thornton Park, MD, MPH

## 2022-01-06 ENCOUNTER — Other Ambulatory Visit: Payer: Medicaid Other

## 2022-01-06 DIAGNOSIS — Z681 Body mass index (BMI) 19 or less, adult: Secondary | ICD-10-CM | POA: Diagnosis not present

## 2022-01-06 DIAGNOSIS — Z30011 Encounter for initial prescription of contraceptive pills: Secondary | ICD-10-CM | POA: Diagnosis not present

## 2022-01-06 DIAGNOSIS — Z0000 Encounter for general adult medical examination without abnormal findings: Secondary | ICD-10-CM | POA: Diagnosis not present

## 2022-01-06 DIAGNOSIS — R630 Anorexia: Secondary | ICD-10-CM

## 2022-01-06 DIAGNOSIS — N946 Dysmenorrhea, unspecified: Secondary | ICD-10-CM | POA: Diagnosis not present

## 2022-01-06 DIAGNOSIS — Z3040 Encounter for surveillance of contraceptives, unspecified: Secondary | ICD-10-CM | POA: Diagnosis not present

## 2022-01-06 DIAGNOSIS — Z113 Encounter for screening for infections with a predominantly sexual mode of transmission: Secondary | ICD-10-CM | POA: Diagnosis not present

## 2022-01-06 DIAGNOSIS — Z0001 Encounter for general adult medical examination with abnormal findings: Secondary | ICD-10-CM | POA: Diagnosis not present

## 2022-01-07 LAB — HELICOBACTER PYLORI  SPECIAL ANTIGEN
MICRO NUMBER:: 13578001
RESULT:: DETECTED — AB
SPECIMEN QUALITY: ADEQUATE

## 2022-01-08 ENCOUNTER — Other Ambulatory Visit: Payer: Self-pay

## 2022-01-09 ENCOUNTER — Other Ambulatory Visit: Payer: Self-pay

## 2022-01-09 ENCOUNTER — Ambulatory Visit (HOSPITAL_COMMUNITY)
Admission: RE | Admit: 2022-01-09 | Discharge: 2022-01-09 | Disposition: A | Discharge status: Home / Self Care | Payer: Medicaid Other | Source: Ambulatory Visit | Attending: Gastroenterology | Admitting: Gastroenterology

## 2022-01-09 DIAGNOSIS — R630 Anorexia: Secondary | ICD-10-CM | POA: Insufficient documentation

## 2022-01-09 DIAGNOSIS — A048 Other specified bacterial intestinal infections: Secondary | ICD-10-CM

## 2022-01-09 DIAGNOSIS — R634 Abnormal weight loss: Secondary | ICD-10-CM | POA: Diagnosis not present

## 2022-01-09 MED ORDER — BISMUTH/METRONIDAZ/TETRACYCLIN 140-125-125 MG PO CAPS: 3.0000 | ORAL_CAPSULE | Freq: Three times a day (TID) | ORAL | 0 refills | Status: DC

## 2022-01-09 MED ORDER — SODIUM CHLORIDE (PF) 0.9 % IJ SOLN
INTRAMUSCULAR | Status: AC
  Filled 2022-01-09: qty 50

## 2022-01-09 MED ORDER — IOHEXOL 300 MG/ML  SOLN
100.0000 mL | Freq: Once | INTRAMUSCULAR | Status: AC | PRN
  Administered 2022-01-09: 100 mL via INTRAVENOUS

## 2022-02-10 DIAGNOSIS — H5213 Myopia, bilateral: Secondary | ICD-10-CM | POA: Diagnosis not present

## 2022-05-01 IMAGING — CR DG FINGER RING 2+V*L*
3 series · 3 of 3 positions shown · non-contrast
Comparison: None.

CLINICAL DATA: Laceration of the ring finger.

EXAM:
LEFT RING FINGER 2+V

[x finger pa left]
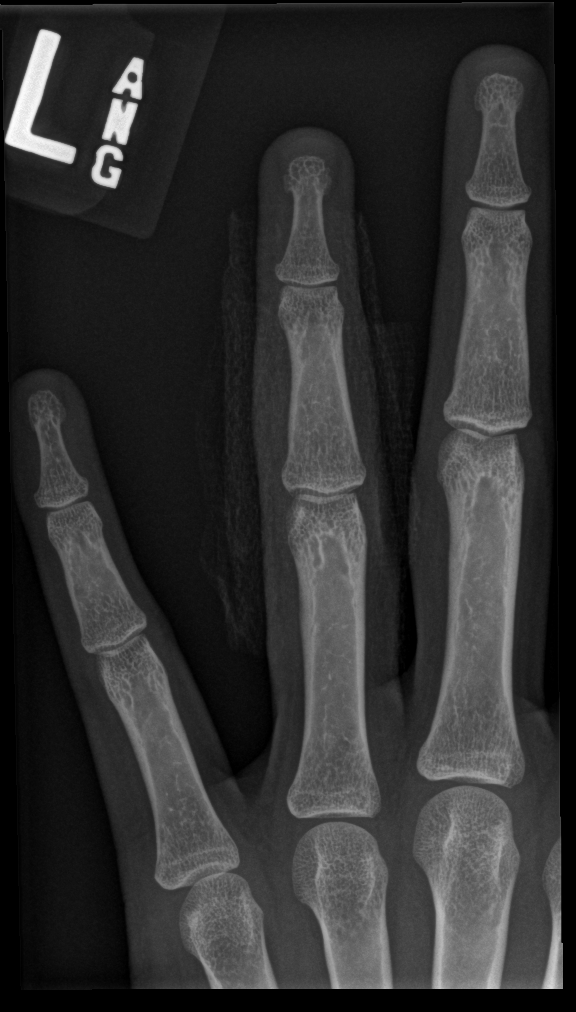

[x finger obl left]
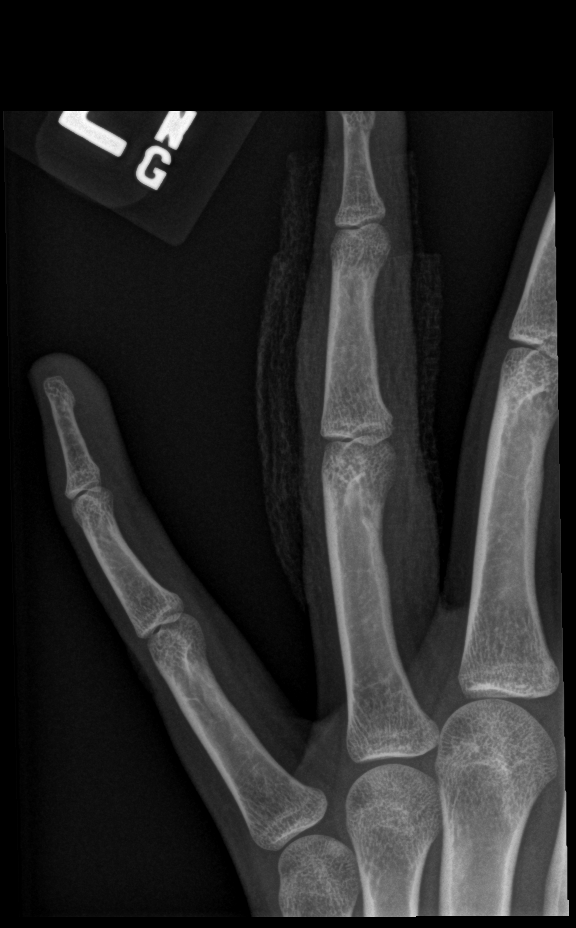

[x finger lat left]
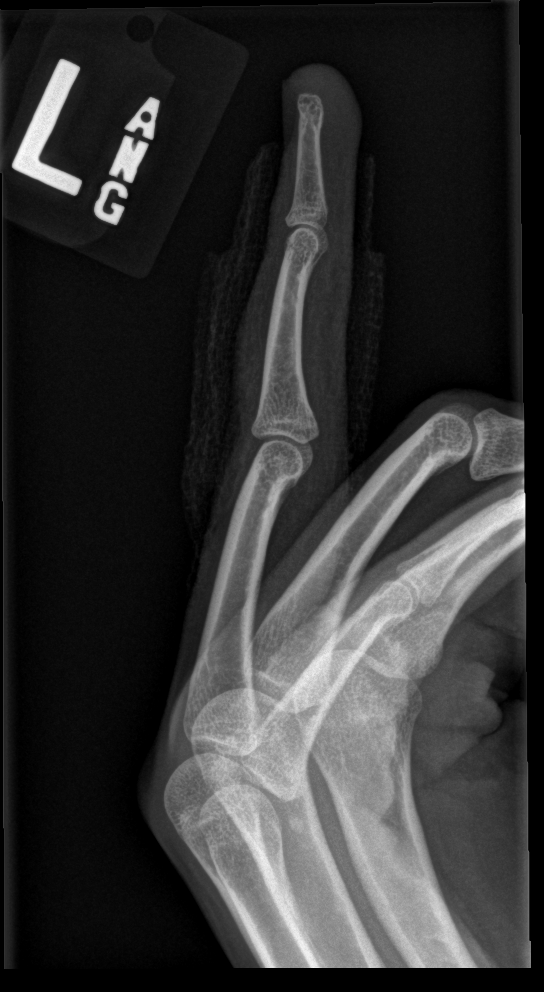

[3 of 3 positions shown; findings below may reference images not displayed]

FINDINGS: The joint spaces are maintained. No acute fracture is identified. No
radiopaque foreign body but exam limited by overlying bandage.
IMPRESSION: No acute bony findings or radiopaque foreign body.

## 2022-05-06 NOTE — Progress Notes (Unsigned)
Referring Provider: No ref. provider found Primary Care Physician:  Pcp, No   Reason for Consultation:  Weight loss   IMPRESSION:  Anorexia with unexplained 30 pound weight loss without localizing GI symptoms Appetite previously stable while on SSRI therapy   PLAN: - CMP, CBC, TSH - H pylori stool antigen - CT abd/pelvis if labs are normal - Establish care with PCP to evaluate for nonGI causes of anorexia and weight loss - She will continue to take her Remeron while we are investigating her symptoms although she does not want to be on this in the long term   HPI: Patriece Decker is a 26 y.o. female who presents for evaluation of weight loss and abdominal pain. She has a history of anxiety and depression.  She has always been thin and normally only eats one meal daily.  PCP previously treated her depression with a medication that increased her appetite.  Since stopping the medication because she no longer needed it for depression, she has lost at least 30 pounds over the last 2 months. She is followed closely by a therapy but has not seen a doctor since the medication changes. She resumed her antidepressant last month and has started to regain weight. But, she doesn't want to require medications.    She does not feel that the anorexia is related to anxiety or depression.  She has some heartburn but she does not use medications.  Most frequently occurs in the morning but has not occurred recently. No dysphagia, odynophagia.  GI ROS is otherwise negative.   ED evaluation 11/30/2021 for chest pain. CXR was normal.   Labs 11/30/21 showed normal BMP except for a glucose of 104, normal CBC.   H pylori stool antigen positive 01/06/22.  Treated with Pylera for 10 days. Returns today after completing treatment.    Past Medical History:  Diagnosis Date   Depression     Past Surgical History:  Procedure Laterality Date   NO PAST SURGERIES      Current Outpatient  Medications  Medication Sig Dispense Refill   Bismuth/Metronidaz/Tetracyclin (PYLERA) 140-125-125 MG CAPS Take 3 capsules by mouth 4 (four) times daily -  before meals and at bedtime for 10 days. 168 capsule 0   ibuprofen (ADVIL) 600 MG tablet Take 1 tablet (600 mg total) by mouth every 6 (six) hours. 30 tablet 0   mirtazapine (REMERON) 7.5 MG tablet Take 7.5 mg by mouth at bedtime.     Prenatal Vit-Fe Fumarate-FA (MULTIVITAMIN-PRENATAL) 27-0.8 MG TABS tablet Take 1 tablet by mouth daily at 12 noon.     Vitamin D, Ergocalciferol, (DRISDOL) 1.25 MG (50000 UNIT) CAPS capsule Take 50,000 Units by mouth once.     No current facility-administered medications for this visit.    Allergies as of 05/07/2022   (No Known Allergies)    Family History  Problem Relation Age of Onset   Colon cancer Neg Hx    Colon polyps Neg Hx       Physical Exam: General:   Alert,  well-nourished, pleasant and cooperative in NAD Head:  Normocephalic and atraumatic. Eyes:  Sclera clear, no icterus.   Conjunctiva pink. Abdomen:  Soft, nontender, nondistended, normal bowel sounds, no rebound or guarding. No hepatosplenomegaly.   Neurologic:  Alert and  oriented x4;  grossly nonfocal Skin:  Intact without significant lesions or rashes. Psych:  Alert and cooperative. Normal mood and affect.    Manual Navarra L. Tarri Glenn, MD, MPH 05/06/2022, 10:47 PM

## 2022-05-07 ENCOUNTER — Encounter: Payer: Self-pay | Admitting: Gastroenterology

## 2022-05-07 ENCOUNTER — Ambulatory Visit (INDEPENDENT_AMBULATORY_CARE_PROVIDER_SITE_OTHER): Payer: Medicaid Other | Admitting: Gastroenterology

## 2022-05-07 VITALS — BP 90/60 | HR 84 | Ht 71.0 in | Wt 131.4 lb

## 2022-05-07 DIAGNOSIS — A048 Other specified bacterial intestinal infections: Secondary | ICD-10-CM | POA: Diagnosis not present

## 2022-05-07 NOTE — Patient Instructions (Signed)
It was a pleasure to see you today. I am so glad that you are feeling better.  Please establish with a primary care provider.   We did not schedule a follow-up appointment. However, please let me know if you need anything in the future.

## 2022-07-02 DIAGNOSIS — R300 Dysuria: Secondary | ICD-10-CM | POA: Diagnosis not present

## 2022-07-02 DIAGNOSIS — N390 Urinary tract infection, site not specified: Secondary | ICD-10-CM | POA: Diagnosis not present

## 2022-08-28 ENCOUNTER — Ambulatory Visit: Payer: Medicaid Other | Admitting: Family

## 2023-04-11 IMAGING — CR DG CHEST 2V
2 series · 2 of 2 positions shown · non-contrast
Comparison: None Available.

CLINICAL DATA: Chest pain

EXAM:
CHEST - 2 VIEW

[w chest pa]
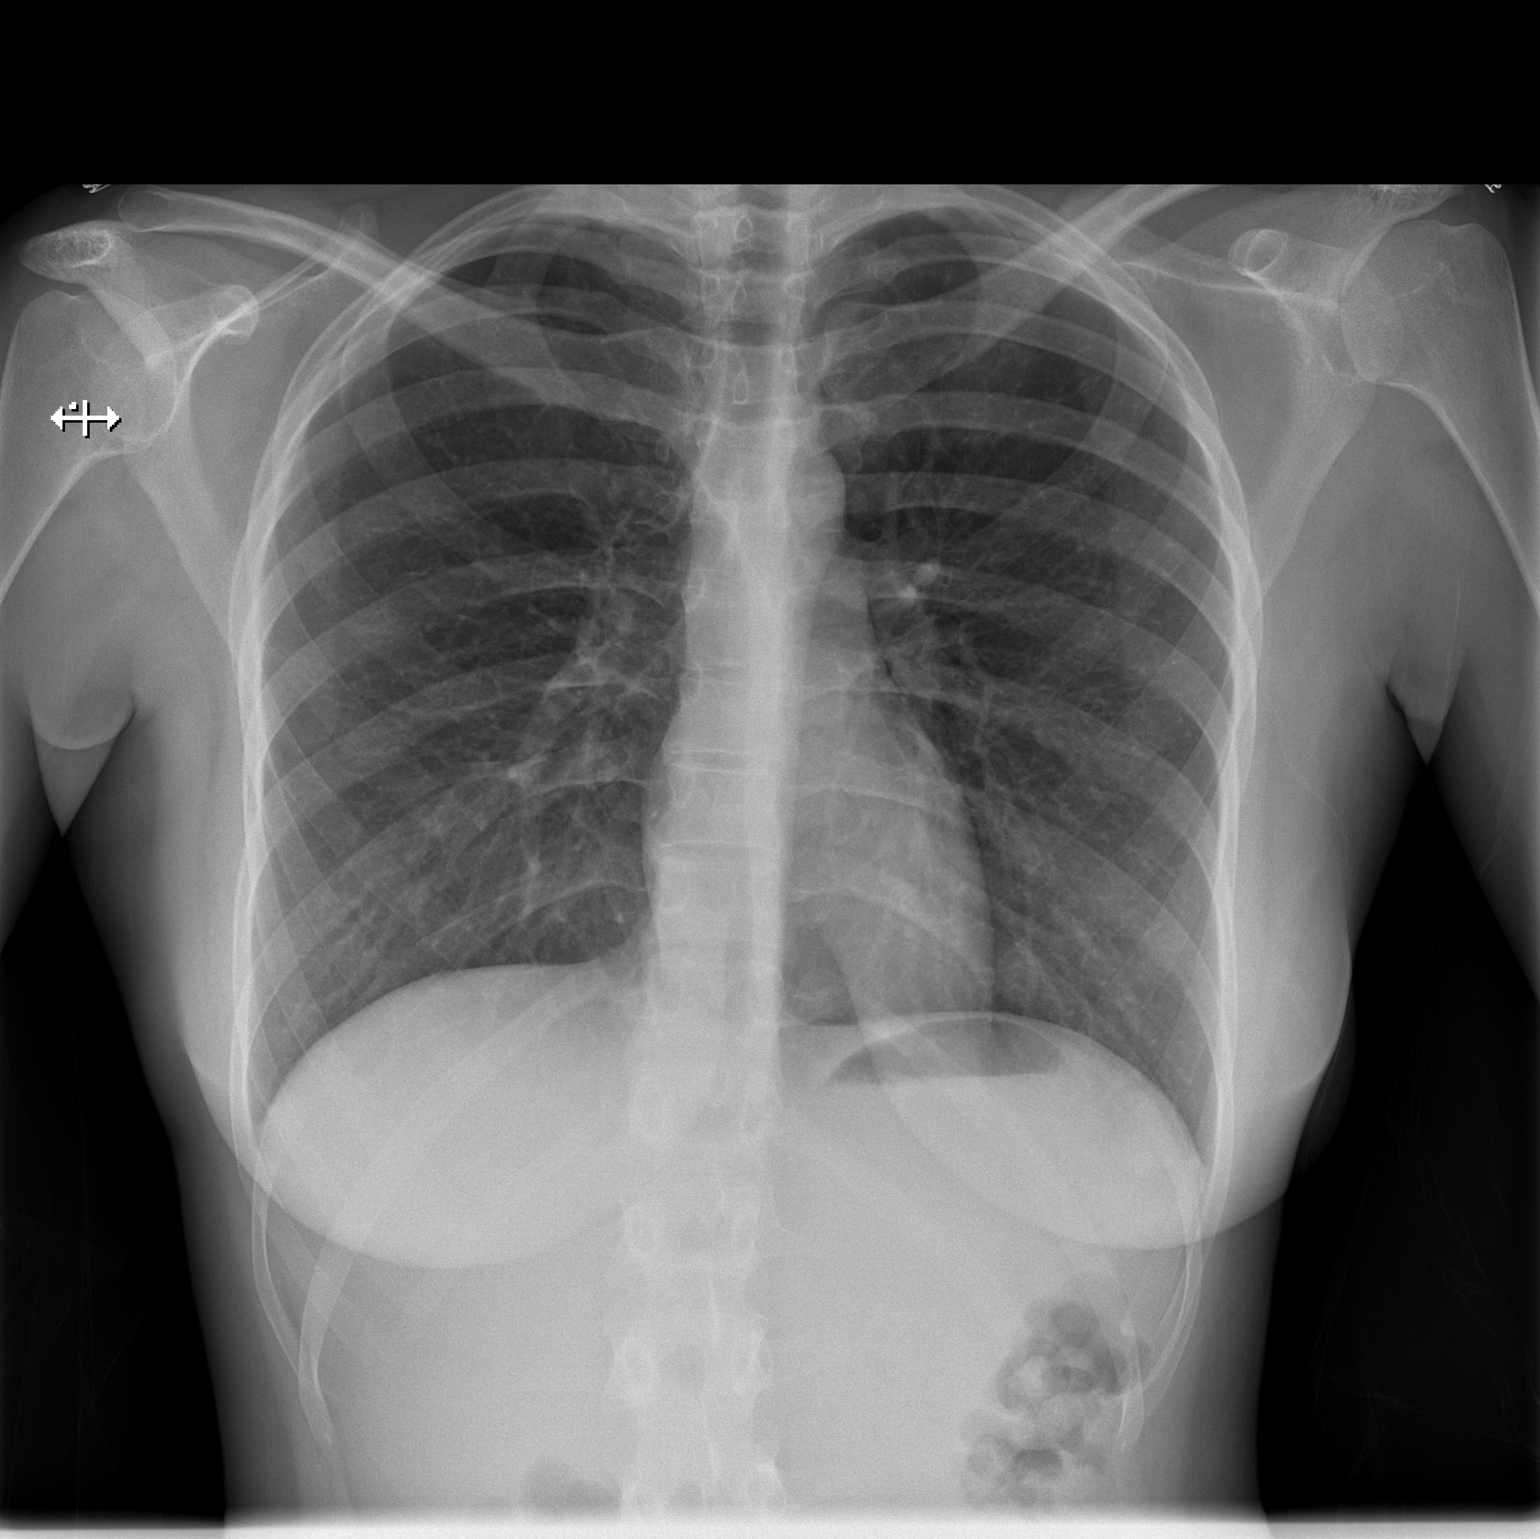

[w chest lat]
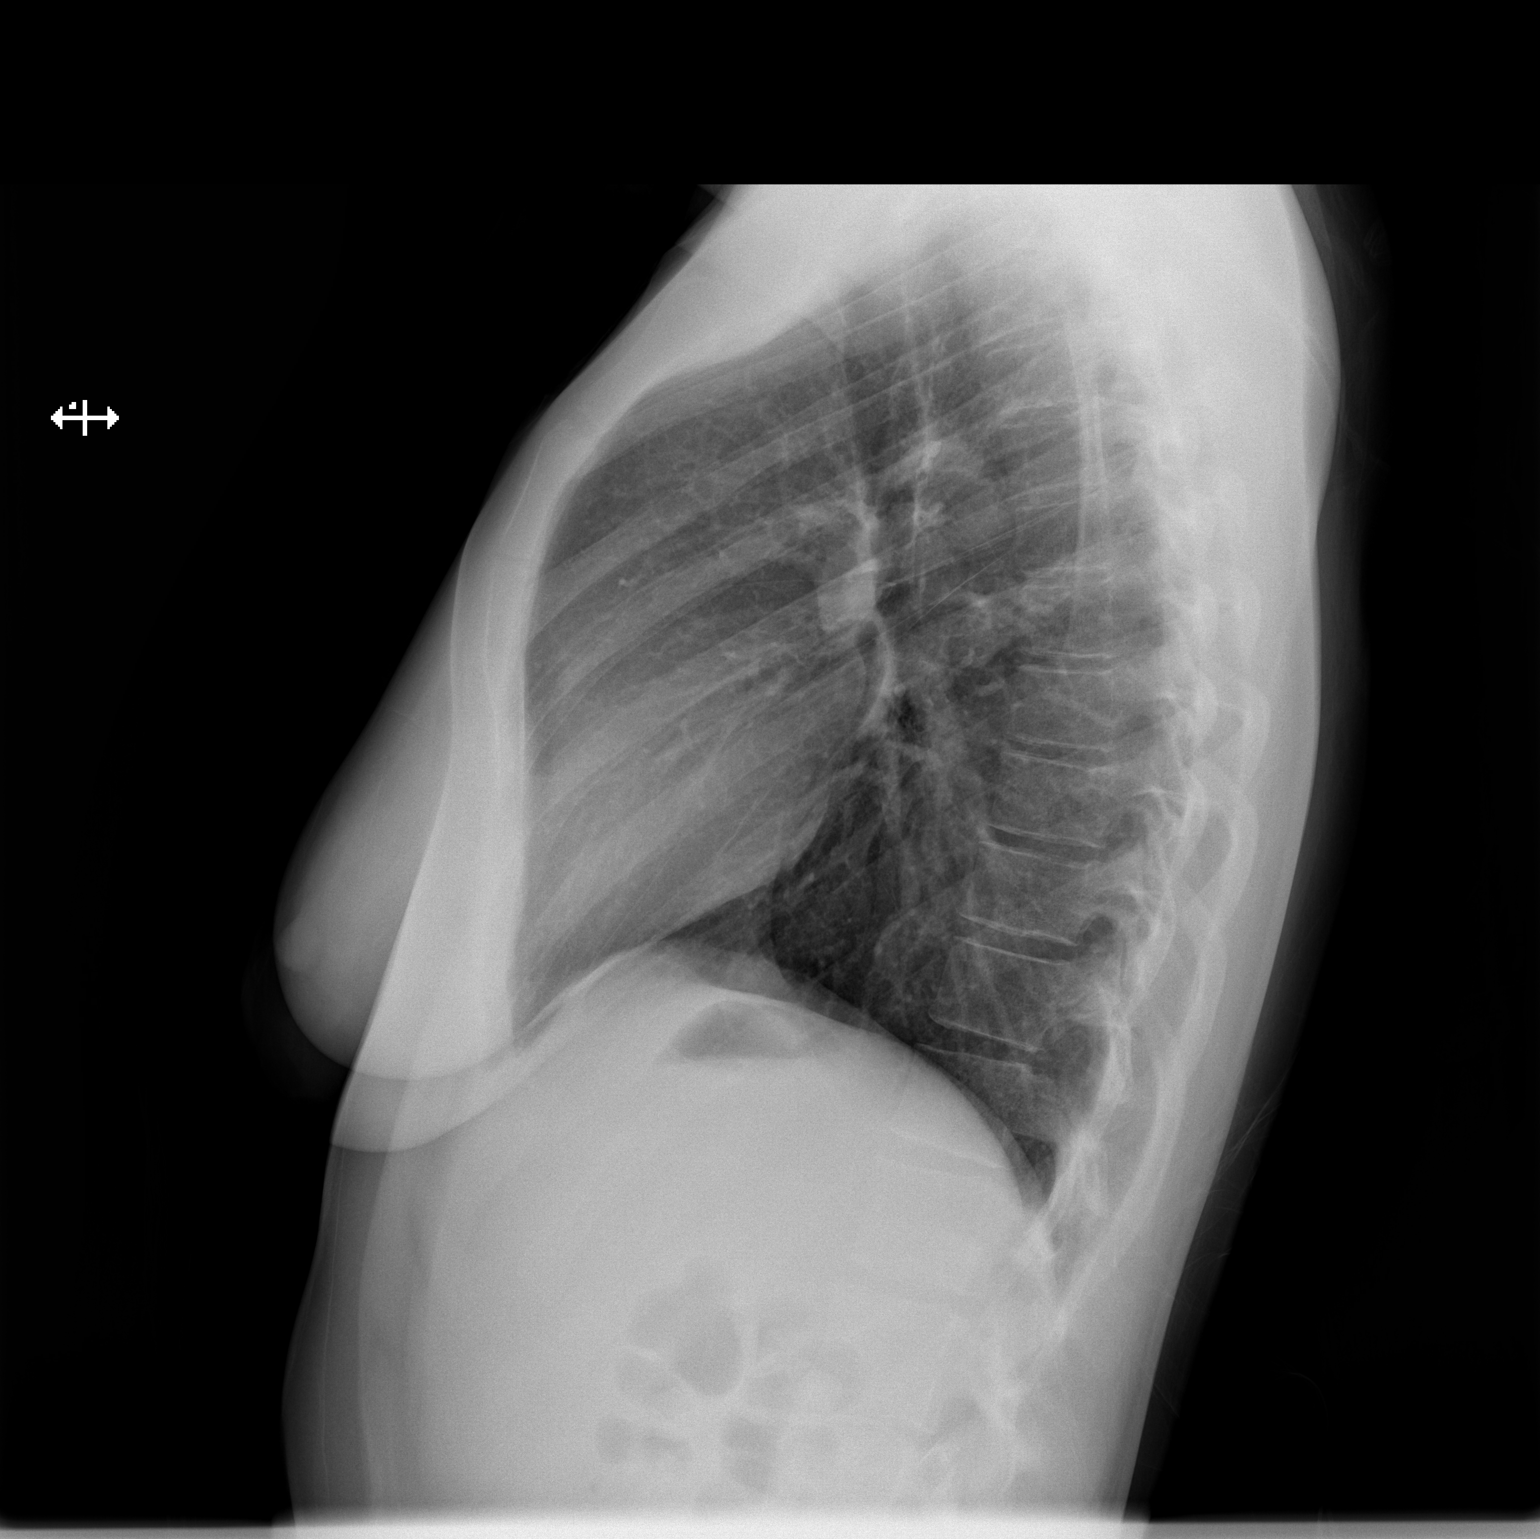

[2 of 2 positions shown; findings below may reference images not displayed]

FINDINGS: The heart size and mediastinal contours are within normal limits.
Both lungs are clear. The visualized skeletal structures are
unremarkable.
IMPRESSION: No active cardiopulmonary disease.

## 2023-04-27 DIAGNOSIS — H9193 Unspecified hearing loss, bilateral: Secondary | ICD-10-CM | POA: Diagnosis not present

## 2023-04-30 ENCOUNTER — Ambulatory Visit: Payer: Medicaid Other | Admitting: Family Medicine

## 2023-04-30 DIAGNOSIS — Z0000 Encounter for general adult medical examination without abnormal findings: Secondary | ICD-10-CM

## 2023-05-04 DIAGNOSIS — Z3200 Encounter for pregnancy test, result unknown: Secondary | ICD-10-CM | POA: Diagnosis not present

## 2023-05-04 DIAGNOSIS — R0602 Shortness of breath: Secondary | ICD-10-CM | POA: Diagnosis not present

## 2023-05-04 DIAGNOSIS — R5383 Other fatigue: Secondary | ICD-10-CM | POA: Diagnosis not present

## 2023-05-04 DIAGNOSIS — Z0000 Encounter for general adult medical examination without abnormal findings: Secondary | ICD-10-CM | POA: Diagnosis not present

## 2023-05-04 DIAGNOSIS — E559 Vitamin D deficiency, unspecified: Secondary | ICD-10-CM | POA: Diagnosis not present

## 2023-05-04 DIAGNOSIS — Z1331 Encounter for screening for depression: Secondary | ICD-10-CM | POA: Diagnosis not present

## 2023-05-04 DIAGNOSIS — Z1159 Encounter for screening for other viral diseases: Secondary | ICD-10-CM | POA: Diagnosis not present

## 2023-05-04 DIAGNOSIS — Z1339 Encounter for screening examination for other mental health and behavioral disorders: Secondary | ICD-10-CM | POA: Diagnosis not present

## 2023-05-04 DIAGNOSIS — Z13220 Encounter for screening for lipoid disorders: Secondary | ICD-10-CM | POA: Diagnosis not present

## 2023-05-04 DIAGNOSIS — D539 Nutritional anemia, unspecified: Secondary | ICD-10-CM | POA: Diagnosis not present

## 2023-05-11 DIAGNOSIS — Z124 Encounter for screening for malignant neoplasm of cervix: Secondary | ICD-10-CM | POA: Diagnosis not present

## 2023-05-11 DIAGNOSIS — Z0000 Encounter for general adult medical examination without abnormal findings: Secondary | ICD-10-CM | POA: Diagnosis not present

## 2023-05-11 DIAGNOSIS — Z309 Encounter for contraceptive management, unspecified: Secondary | ICD-10-CM | POA: Diagnosis not present

## 2023-05-25 ENCOUNTER — Ambulatory Visit: Payer: Medicaid Other | Admitting: Family Medicine

## 2023-06-01 DIAGNOSIS — Z681 Body mass index (BMI) 19 or less, adult: Secondary | ICD-10-CM | POA: Diagnosis not present

## 2023-06-01 DIAGNOSIS — R0602 Shortness of breath: Secondary | ICD-10-CM | POA: Diagnosis not present

## 2023-06-17 DIAGNOSIS — S39012A Strain of muscle, fascia and tendon of lower back, initial encounter: Secondary | ICD-10-CM | POA: Diagnosis not present

## 2023-06-17 DIAGNOSIS — X501XXA Overexertion from prolonged static or awkward postures, initial encounter: Secondary | ICD-10-CM | POA: Diagnosis not present

## 2023-06-17 DIAGNOSIS — J00 Acute nasopharyngitis [common cold]: Secondary | ICD-10-CM | POA: Diagnosis not present

## 2023-06-17 DIAGNOSIS — R0981 Nasal congestion: Secondary | ICD-10-CM | POA: Diagnosis not present

## 2023-06-21 DIAGNOSIS — M549 Dorsalgia, unspecified: Secondary | ICD-10-CM | POA: Diagnosis not present

## 2023-06-21 DIAGNOSIS — M546 Pain in thoracic spine: Secondary | ICD-10-CM | POA: Diagnosis not present

## 2023-06-21 DIAGNOSIS — M545 Low back pain, unspecified: Secondary | ICD-10-CM | POA: Diagnosis not present

## 2023-06-21 DIAGNOSIS — Z3200 Encounter for pregnancy test, result unknown: Secondary | ICD-10-CM | POA: Diagnosis not present

## 2023-09-19 DIAGNOSIS — R42 Dizziness and giddiness: Secondary | ICD-10-CM | POA: Diagnosis not present

## 2023-09-19 DIAGNOSIS — G9332 Myalgic encephalomyelitis/chronic fatigue syndrome: Secondary | ICD-10-CM | POA: Diagnosis not present

## 2023-09-19 DIAGNOSIS — R55 Syncope and collapse: Secondary | ICD-10-CM | POA: Diagnosis not present

## 2023-09-20 DIAGNOSIS — R0789 Other chest pain: Secondary | ICD-10-CM | POA: Diagnosis not present

## 2023-11-02 ENCOUNTER — Ambulatory Visit (INDEPENDENT_AMBULATORY_CARE_PROVIDER_SITE_OTHER)

## 2023-11-02 DIAGNOSIS — F4010 Social phobia, unspecified: Secondary | ICD-10-CM | POA: Diagnosis not present

## 2023-11-02 NOTE — Progress Notes (Signed)
 Comprehensive Clinical Assessment (CCA) Note  11/02/2023 Lindsey Decker [id number]  Chief Complaint:  Chief Complaint  Patient presents with   Anxiety   Visit Diagnosis: Social Anxiety Disorder R/O Major Depressive Disorder, Mild R/O Unspecified Anxiety Disorder   CCA Screening, Triage and Referral (STR)  Patient Reported Information How did you hear about us ? No data recorded Referral name: Spouse  Referral phone number: No data recorded  Whom do you see for routine medical problems? -- (not a specific person. just calls a doctor and goes.)  Practice/Facility Name: No data recorded Practice/Facility Phone Number: No data recorded Name of Contact: No data recorded Contact Number: No data recorded Contact Fax Number: No data recorded Prescriber Name: No data recorded Prescriber Address (if known): No data recorded  What Is the Reason for Your Visit/Call Today? anxiety and depression  How Long Has This Been Causing You Problems? 1-6 months  What Do You Feel Would Help You the Most Today? Medication(s)   Have You Recently Been in Any Inpatient Treatment (Hospital/Detox/Crisis Center/28-Day Program)? No  Name/Location of Program/Hospital:No data recorded How Long Were You There? No data recorded When Were You Discharged? No data recorded  Have You Ever Received Services From [facility] Before? No  Who Do You See at [facility]? No data recorded  Have You Recently Had Any Thoughts About Hurting Yourself? No  Are You Planning to Commit Suicide/Harm Yourself At This time? No   Have you Recently Had Thoughts About Hurting Someone Marigene Shoulder? No  Explanation: No data recorded  Have You Used Any Alcohol or Drugs in the Past 24 Hours? No  How Long Ago Did You Use Drugs or Alcohol? No data recorded What Did You Use and How Much? No data recorded  Do You Currently Have a Therapist/Psychiatrist? No  Name of Therapist/Psychiatrist: No data recorded  Have You  Been Recently Discharged From Any Office Practice or Programs? No  Explanation of Discharge From Practice/Program: No data recorded    CCA Screening Triage Referral Assessment Type of Contact: Face-to-Face  Is this Initial or Reassessment? No data recorded Date Telepsych consult ordered in [facility]:  No data recorded Time Telepsych consult ordered in [facility]:  No data recorded  Patient Reported Information Reviewed? No data recorded Patient Left Without Being Seen? No data recorded Reason for Not Completing Assessment: No data recorded  Collateral Involvement: none   Does Patient Have a Court Appointed Legal Guardian? No data recorded Name and Contact of Legal Guardian: No data recorded If Minor and Not Living with Parent(s), Who has Custody? No data recorded Is CPS involved or ever been involved? Never  Is APS involved or ever been involved? Never   Patient Determined To Be At Risk for Harm To Self or Others Based on Review of Patient Reported Information or Presenting Complaint? No  Method: No Plan  Availability of Means: No access or NA  Intent: Vague intent or NA  Notification Required: No need or identified person  Additional Information for Danger to Others Potential: No data recorded Additional Comments for Danger to Others Potential: No data recorded Are There Guns or Other Weapons in Your Home? No  Types of Guns/Weapons: No data recorded Are These Weapons Safely Secured?                            No data recorded Who Could Verify You Are Able To Have These Secured: No data recorded Do You Have  any Outstanding Charges, Pending Court Dates, Parole/Probation? none  Contacted To Inform of Risk of Harm To Self or Others: No data recorded  Location of Assessment: [facility]   Does Patient Present under Involuntary Commitment? No  IVC Papers Initial File Date: No data recorded  [place] of Residence: [place]   Patient Currently Receiving the  Following Services: Not Receiving Services   Determination of Need: Routine (7 days)   Options For Referral: Medication Management     CCA Biopsychosocial Intake/Chief Complaint:  Anxiety and depression: GAD-7=7 and PH-2 =1 Lindsey Decker presents as a walk in today. She says she was diagnosed with Social Anxiety as a child. She does not remember who diagnosed her.  She endorses the social Anxiety symptoms in the DSM-5.  Lindsey Decker says the symptoms have improved some through the years but she tries to avoid social situations so she will not have the symptoms. She says she has a large family being one of 10 children in the family and so she does not need to socialize much with others. Lindsey Decker says she has a panic attack approximately every 3 months. These attacks seemed to be cued.  She describes having paresthesias in her hands and arms, trembling, difficulty breathing and chest pain. Though Lindsey Decker says she avoided social situations beginning in childhood, she later said her social anxity never bothered her until she starting having these issues6 months ago:  Not getting a job, as she has a Energy manager degree in IT and reports she cannot locate any openings. She notes that being underweight has contributed to her social anxiety. She says she does not understand why her appetite is poor.   PHQ-9 did not populate since the score on the PHQ-2 was (1) one. Ghad endorses lose of weight/appetite and fatigue as her depressive symptoms. She says she was once diagnosed with depression and went on medication(Treatment dates are unknown). The episode resolved and she discontinued the medication.  Lindsey Decker says she has struggled with her weight for a long time. She denies any criteria of an eating disorder today, however her chart indicates on 05-07-22 she presented with H pylori.  Lindsey Decker denies any history or current abuse  Family HX:  Lindsey Decker reports she thinks her husband was diagnosed with schizophrenia. She says he is treated  at this clinic. Lindsey Decker reports her oldest brother is in addictive addiction.  Social HX:  Lindsey Decker met developmental milestones appropriately, She has a bachelor's degree in Information Technology but reports she cannot locate a job in this field, so she is unemployed.  Reeanna denies any prior or pending legal charges.  She is currently living with her husband, and her 9 year old daughter. She reports she is of the Muslim faith.  Kanyla denies any past or current issues with addiction.  Jesscia says she is not interested in therapy but does want medication management. She decides to walk in, as she can be seen sooner.  Abbegail reports she does not have a PCP so this therapist provides two sources for pts with no insurance. She does meet with the medicaid staff who works in this clinic after her CCA.   Current Symptoms/Problems: anxiety And depression  Patient Reported Schizophrenia/Schizoaffective Diagnosis in Past: No   Strengths: kind, giving  Preferences: "to be stress free'  Abilities: being a good mom   Type of Services Patient Feels are Needed: therapy   Initial Clinical Notes/Concerns: No data recorded  Mental Health Symptoms Depression:  Fatigue; Increase/decrease in appetite  Duration of Depressive symptoms: Greater than two weeks   Mania:  None   Anxiety:   Difficulty concentrating; Fatigue; Tension; Irritability; Worrying   Psychosis:  None   Duration of Psychotic symptoms: No data recorded  Trauma:  None   Obsessions:  None   Compulsions:  None   Inattention:  None   Hyperactivity/Impulsivity:  No data recorded  Oppositional/Defiant Behaviors:  None   Emotional Irregularity:  None   Other Mood/Personality Symptoms:  No data recorded   Mental Status Exam Appearance and self-care  Stature:  Tall   Weight:  Thin   Clothing:  Neat/clean   Grooming:  Well-groomed   Cosmetic use:  Age appropriate   Posture/gait:  Normal   Motor activity:  Not  Remarkable   Sensorium  Attention:  Normal   Concentration:  Normal   Orientation:  X5   Recall/memory:  Normal   Affect and Mood  Affect:  Full Range   Mood:  Anxious   Relating  Eye contact:  Normal   Facial expression:  Responsive   Attitude toward examiner:  Cooperative   Thought and Language  Speech flow: Clear and Coherent   Thought content:  Appropriate to Mood and Circumstances   Preoccupation:  None   Hallucinations:  None   Organization:  No data recorded  [employer] of Knowledge:  Average   Intelligence:  Average   Abstraction:  Abstract   Judgement:  Normal   Reality Testing:  Adequate   Insight:  Fair   Decision Making:  Normal   Social Functioning  Social Maturity:  Responsible   Social Judgement:  Normal   Stress  Stressors:  No data recorded  Coping Ability:  -- (only occasionally overwhelmed)   Skill Deficits:  None   Supports:  Church; Family; Friends/Service system     Religion: Religion/Spirituality Are You A Religious Person?: Yes What is Your Religious Affiliation?: Muslim How Might This Affect Treatment?: it will not  Leisure/Recreation: Leisure / Recreation Do You Have Hobbies?: No  Exercise/Diet: Exercise/Diet Do You Exercise?: No Have You Gained or Lost A Significant Amount of Weight in the Past Six Months?: Yes-Gained Number of Pounds Gained: 7 Do You Follow a Special Diet?: No Do You Have Any Trouble Sleeping?: No   CCA Employment/Education Employment/Work Situation: Employment / Work Situation Employment Situation: Unemployed Has Patient ever Been in Equities trader?: No  Education: Education Is Patient Currently Attending School?: No Last Grade Completed: 16 Did You Product manager?: Yes What Type of College Degree Do you Have?: IT Did You Attend Graduate School?: No Did You Have An Individualized Education Program (IIEP): No Did You Have Any Difficulty At School?: No Patient's  Education Has Been Impacted by Current Illness: No   CCA Family/Childhood History Family and Relationship History: Family history Marital status: Married Number of Years Married: 4 What types of issues is patient dealing with in the relationship?: no issues Are you sexually active?: Yes Does patient have children?: Yes How many children?: 1 How is patient's relationship with their children?: "amazing"  Childhood History:  Childhood History By whom was/is the patient raised?: Both parents Description of patient's relationship with caregiver when they were a child: "good" Patient's description of current relationship with people who raised him/her: "good" How were you disciplined when you got in trouble as a child/adolescent?: talk Does patient have siblings?: Yes Number of Siblings: 10 Description of patient's current relationship with siblings: "good" Did patient suffer any verbal/emotional/physical/sexual abuse  as a child?: No Did patient suffer from severe childhood neglect?: No Has patient ever been sexually abused/assaulted/raped as an adolescent or adult?: No Was the patient ever a victim of a crime or a disaster?: No Witnessed domestic violence?: No Has patient been affected by domestic violence as an adult?: No  Child/Adolescent Assessment:     CCA Substance Use Alcohol/Drug Use:  None                           ASAM's:  Six Dimensions of Multidimensional Assessment  Dimension 1:  Acute Intoxication and/or Withdrawal Potential:      Dimension 2:  Biomedical Conditions and Complications:      Dimension 3:  Emotional, Behavioral, or Cognitive Conditions and Complications:     Dimension 4:  Readiness to Change:     Dimension 5:  Relapse, Continued use, or Continued Problem Potential:     Dimension 6:  Recovery/Living Environment:     ASAM Severity Score:    ASAM Recommended Level of Treatment:     Substance use Disorder (SUD)    Recommendations for  Services/Supports/Treatments:    DSM5 Diagnoses: Patient Active Problem List   Diagnosis Date Noted   Normal labor and delivery 06/11/2021   Vitamin D deficiency, unspecified 06/26/2019   Underweight 06/26/2019    Patient Centered Plan: Patient is on the following Treatment Plan(s):  Medication managment   Referrals to Alternative Service(s): Referred to Alternative Service(s):   Place:   Date:   Time:    Referred to Alternative Service(s):   Place:   Date:   Time:    Referred to Alternative Service(s):   Place:   Date:   Time:    Referred to Alternative Service(s):   Place:   Date:   Time:      Collaboration of Care: n/a  Patient/Guardian was advised Release of Information must be obtained prior to any record release in order to collaborate their care with an outside provider. Patient/Guardian was advised if they have not already done so to contact the registration department to sign all necessary forms in order for us  to release information regarding their care.   Consent: Patient/Guardian gives verbal consent for treatment and assignment of benefits for services provided during this visit. Patient/Guardian expressed understanding and agreed to proceed.   Earnie Gola, MS, LMFT, LCAS

## 2023-11-04 ENCOUNTER — Ambulatory Visit (INDEPENDENT_AMBULATORY_CARE_PROVIDER_SITE_OTHER): Admitting: Student

## 2023-11-04 ENCOUNTER — Encounter (HOSPITAL_COMMUNITY): Payer: Self-pay | Admitting: Student

## 2023-11-04 VITALS — BP 111/73 | HR 73 | Ht 71.0 in | Wt 134.6 lb

## 2023-11-04 DIAGNOSIS — F411 Generalized anxiety disorder: Secondary | ICD-10-CM | POA: Diagnosis not present

## 2023-11-04 DIAGNOSIS — F4010 Social phobia, unspecified: Secondary | ICD-10-CM | POA: Diagnosis not present

## 2023-11-04 MED ORDER — MIRTAZAPINE 15 MG PO TABS: 15.0000 mg | ORAL_TABLET | Freq: Every day | ORAL | 1 refills | Status: DC

## 2023-11-04 MED ORDER — PROPRANOLOL HCL 10 MG PO TABS: 10.0000 mg | ORAL_TABLET | Freq: Two times a day (BID) | ORAL | 1 refills | Status: DC | PRN

## 2023-11-04 NOTE — Progress Notes (Signed)
 Psychiatric Initial Adult Assessment  Patient Identification: Lindsey Decker MRN:  [id number] Date of Evaluation:  11/04/2023 Referral Source: Walk-in  Assessment:  Lindsey Decker is a 28 y.o. female with a history of MDD, panic disorder, and remote social anxiety disorder who presents in person to [facility] for initial evaluation of anxiety.  Patient reports longstanding anxiety with worsening over the past 6 months.  She denies symptoms consistent with MDD.  However, she does report decreased appetite, which is chronic.  She did have a previously positive response to Remeron  at 7.5 mg.  However, it was too sedating, and she would like to have less sedation particularly now that she has a daughter.  She is agreeable to propranolol  as an as needed medication for her anxiety.  Risks, benefits, and adverse effects discussed.  Patient denies history of asthma and cocaine usage.  Patient poses no safety concerns toward herself nor others, and she has a great support system in her husband and family.  Risk Assessment: A suicide and violence risk assessment was performed as part of this evaluation. There patient is deemed to be at chronic elevated risk for self-harm/suicide given the following factors: N/A. These risk factors are mitigated by the following factors: lack of active SI/HI, no known access to weapons or firearms, no history of previous suicide attempts, no history of violence, motivation for treatment, utilization of positive coping skills, supportive family, sense of responsibility to family and social supports, minor children living at home, presence of a significant relationship, presence of an available support system, expresses purpose for living, effective problem solving skills, safe housing, support system in agreement with treatment recommendations, and presence of a safety plan with follow-up care. The patient is deemed to be at chronic  elevated risk for violence given the following factors: N/A. These risk factors are mitigated by the following factors: high intellectual functioning, positive social orientation, religiosity, and connectedness to family. There is no acute risk for suicide or violence at this time. The patient was educated about relevant modifiable risk factors including following recommendations for treatment of psychiatric illness and abstaining from substance abuse.  While future psychiatric events cannot be accurately predicted, the patient does not  currently require  acute inpatient psychiatric care and does not currently meet   involuntary commitment criteria.    Plan:  # GAD # Social anxiety disorder #Appetite suppression, chronic Past medication trials:  Status of problem: New to this Clinical research associate Interventions: -- Start mirtazapine  15 mg nightly for appetite suppression and anxiety -- Start propranolol  10 mg 2 times daily as needed anxiety -- Patient recommended for therapy, but voices that she would prefer to defer at this time. -- Patient recommended to follow up with PCP for appetite suppression.   Return to care in 4-6 weeks.  Patient was given contact information for behavioral health clinic and was instructed to call 911 for emergencies.    Patient and plan of care will be discussed with the Attending MD ,Dr. Eligio Grumbling, who agrees with the above statement and plan.   Subjective:  Chief Complaint:  Chief Complaint  Patient presents with   Establish Care   Anxiety    History of Present Illness:  Patient presents due to worsening anxiety. She reports a  panic attack approximately every 3 months. These attacks seemed to be cued.  She describes having paresthesias in her hands and arms, trembling, difficulty breathing and chest pain.   Anxiety worsened over the past 6 months  due to the following stressors: Not getting a job, as she has a Energy manager degree in IT and reports she  cannot locate any openings. She notes that being underweight has contributed to her social anxiety. She says she does not understand why her appetite is poor. Difficulty concentrating; Fatigue; Tension; Irritability; Worrying.  Grocery shopping, making payments are anxiety-inducing, as eyes are on her.  Stomach tightness, even at home, when she is thinking a lot.   Anxiety does not prevent sleep.   Panic attacks were weekly initially, now monthly, x 6 months. Breathing techniques help to calm her; last approx 7 minutes.   Mirtazapine  was helpful with depressive sx, sleep, and appetite.   On LCAS CCA 11/02/23, GAD-7=7 and PHQ-2 =1.   Depressive sx: Decreased appetite leading to weight loss and fatigue. Denies low mood feeling sad. Sleeping well, denies snoring, and feels well-rested.  -Appetite suppressed most of life. Increased protein and   Disordered Eating: Medical hx of H. Pylori in 2023.  PTSD: Denies trauma hx.   Psychosis: Denies  Denies tobacco, alcohol, drug use.  Denies caffeine consumptions Eats 2 meals per day. Vegetarian by proxy, as she avoids meat. Typical meal- Veggies, pasta. Eats meats if mom makes them, chicken and fish. 4x/week.    Past Psychiatric History:  Diagnoses: MDD, panic disorder, and social anxiety disorder as a child Medication trials: Mirtazapine  (2020-2021), Ambien  (2022)  Previous psychiatrist/therapist: Dr. Jerrold Morgan in 2022, recently established care with Earnie Gola, LCAS for therapy.  Hospitalizations: Denies Suicide attempts: Denies SIB: Denies Hx of violence towards others: Denies Current access to guns: Denies Hx of trauma/abuse: Denies Denies: Seizures/head trauma Denies asthma.   Substance Abuse History in the last 12 months:  No.  Past Medical History:  Past Medical History:  Diagnosis Date   Depression     Past Surgical History:  Procedure Laterality Date   NO PAST SURGERIES      Family Psychiatric History: Denies  Denies  SA/[place]  Drugs: Brother  Family History:  Family History  Problem Relation Age of Onset   Colon cancer Neg Hx    Colon polyps Neg Hx     Social History:   Academic/Vocational: 10 year old, daughter. Married x 4 years. Bachelor's degree in IT. Mom lives 10 minutes from her. Good relationship with parents and siblings. From [place]. Here x 8 years. Born in [place], returned home, and returned to [place] 8 years ago.   -Denies legal charges.  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 Being around children does not trigger anxiety, but around other adults, as she fears being judged.  - She worries that she has difficulty  Social History   Socioeconomic History   Marital status: Married    Spouse name: Not on file   Number of children: 1   Years of education: Not on file   Highest education level: Not on file  Occupational History   Not on file  Tobacco Use   Smoking status: Never   Smokeless tobacco: Never  Vaping Use   Vaping status: Never Used  Substance and Sexual Activity   Alcohol use: Never   Drug use: Never   Sexual activity: Never  Other Topics Concern   Not on file  Social History Narrative   Not on file   Social Drivers of Health   Financial Resource Strain: Not on file  Food Insecurity: Not on file  Transportation Needs: Not on file  Physical Activity: Not on file  Stress: Not  on file  Social Connections: Not on file    Additional Social History: updated  Allergies:  No Known Allergies  Current Medications: Current Outpatient Medications  Medication Sig Dispense Refill   mirtazapine  (REMERON ) 15 MG tablet Take 1 tablet (15 mg total) by mouth at bedtime. 30 tablet 1   Multiple Vitamins-Minerals (ALIVE MULTI-VITAMIN PO) Take 1 tablet by mouth daily.     propranolol  (INDERAL ) 10 MG tablet Take 1 tablet (10 mg total) by mouth 2 (two) times daily as needed. 60 tablet 1   SPRINTEC 28 0.25-35 MG-MCG tablet Take 1 tablet by mouth daily.     No current  facility-administered medications for this visit.    ROS: Review of Systems  Constitutional:  Positive for appetite change. Negative for unexpected weight change.       Chronic  Gastrointestinal: Negative.   Neurological:  Negative for dizziness and headaches.    Objective:  Psychiatric Specialty Exam: Blood pressure 111/73, pulse 73, height 5\' 11"  (1.803 m), weight 134 lb 9.6 oz (61.1 kg), last menstrual period 10/12/2023, SpO2 99%.Body mass index is 18.77 kg/m.  General Appearance: Casual  Eye Contact:  Good  Speech:  Clear and Coherent and Normal Rate  Volume:  Normal  Mood:  Anxious  Affect:  Congruent  Thought Content: WDL and Logical   Suicidal Thoughts:  No  Homicidal Thoughts:  No  Thought Process:  Coherent, Goal Directed, and Linear  Orientation:  Full (Time, Place, and Person)    Memory: Immediate;   Good Recent;   Good Remote;   Good  Judgment:  Fair  Insight:  Fair  Concentration:  Concentration: Good and Attention Span: Good  Recall:  not formally assessed  Fund of Knowledge: Good  Language: Fair  Psychomotor Activity:  Normal  Akathisia:  No  AIMS (if indicated): not done  Assets:  Communication Skills Desire for Improvement Financial Resources/Insurance Housing Intimacy Leisure Time Resilience Social Support Talents/Skills Transportation Vocational/Educational  ADL's:  Intact  Cognition: WNL  Sleep:  Good   PE: General: well-appearing; no acute distress Pulm: no increased work of breathing on room air Strength & Muscle Tone: within normal limits Neuro: no focal neurological deficits observed Gait & Station: normal  Metabolic Disorder Labs: No results found for: "HGBA1C", "MPG" No results found for: "PROLACTIN" No results found for: "CHOL", "TRIG", "HDL", "CHOLHDL", "VLDL", "LDLCALC" Lab Results  Component Value Date   TSH 1.29 12/31/2021    Therapeutic Level Labs: No results found for: "LITHIUM" No results found for: "CBMZ" No  results found for: "VALPROATE"  Screenings:  GAD-7    Flowsheet Row Counselor from 11/02/20[facility] in [facility]  Total GAD-7 Score 6      PHQ2-9    Flowsheet Row Counselor from 11/02/2023 in [facility] Office Visit from 06/26/2019 in [facility]  PHQ-2 Total Score 1 0      Flowsheet Row ED from 11/30/2021 in [facility] Emergency Department at [facility] Admission (Discharged) from 06/11/2021 in [place] 4S Mother Baby Unit Admission (Discharged) from 06/10/2021 in [facility] 1S Maternity Assessment Unit  C-SSRS RISK CATEGORY No Risk No Risk No Risk       Collaboration of Care: Collaboration of Care: Dr. Eligio Grumbling  Patient/Guardian was advised Release of Information must be obtained prior to any record release in order to collaborate their care with an outside provider. Patient/Guardian was advised if they have not already done so to contact the  registration department to sign all necessary forms in order for us  to release information regarding their care.   Consent: Patient/Guardian gives verbal consent for treatment and assignment of benefits for services provided during this visit. Patient/Guardian expressed understanding and agreed to proceed.   Shery Done, MD 4/24/20258:20 AM

## 2023-11-08 ENCOUNTER — Encounter (HOSPITAL_COMMUNITY): Payer: Self-pay | Admitting: Student

## 2023-11-08 DIAGNOSIS — F4010 Social phobia, unspecified: Secondary | ICD-10-CM | POA: Insufficient documentation

## 2023-11-08 DIAGNOSIS — F411 Generalized anxiety disorder: Secondary | ICD-10-CM | POA: Insufficient documentation

## 2023-11-10 NOTE — Addendum Note (Signed)
 Addended by: Ulysess Gang A on: 11/10/2023 10:21 AM   Modules accepted: Level of Service

## 2023-12-09 ENCOUNTER — Encounter (HOSPITAL_COMMUNITY): Payer: Self-pay | Admitting: Student

## 2023-12-09 ENCOUNTER — Ambulatory Visit (INDEPENDENT_AMBULATORY_CARE_PROVIDER_SITE_OTHER): Admitting: Student

## 2023-12-09 VITALS — BP 111/73 | HR 87 | Ht 71.0 in | Wt 159.4 lb

## 2023-12-09 DIAGNOSIS — F4010 Social phobia, unspecified: Secondary | ICD-10-CM | POA: Diagnosis not present

## 2023-12-09 DIAGNOSIS — F411 Generalized anxiety disorder: Secondary | ICD-10-CM | POA: Diagnosis not present

## 2023-12-09 MED ORDER — PROPRANOLOL HCL 10 MG PO TABS: 10.0000 mg | ORAL_TABLET | Freq: Two times a day (BID) | ORAL | 0 refills | Status: AC | PRN

## 2023-12-09 MED ORDER — MIRTAZAPINE 15 MG PO TABS: 15.0000 mg | ORAL_TABLET | Freq: Every day | ORAL | 0 refills | Status: AC

## 2023-12-09 NOTE — Progress Notes (Signed)
 BH MD Outpatient Progress Note  12/09/2023 1:28 PM Lindsey Decker  MRN:  [id number]  Assessment:  Lindsey Decker presents for follow-up evaluation in-person. Today, 12/09/23, patient reports overall improvement since starting Remeron  and propranolol , in terms of anxiety and somatic manifestations of anxiety, sleep, and appetite stimulation. She denies acute concerns or complaints today. She has gained 25 lbs in the past month, and feels a lot better about where her weight is and being able to manage it.   Patient will be traveling abroad x 2 months, and will be provided with a 3 month supply of medications. She is advised that upon her return, she will have a new provider, and she voices understanding. Patient does decline therapy at this time, and do believe that medications have helped her substantially. Advised patient that therapists are available should she need to resume care in the future.   Patient poses no safety concerns toward herself nor others at this time.   Identifying Information: Lindsey Decker is a 28 y.o. female with a history of MDD, panic disorder, and remote social anxiety disorder  who is an established patient with [facility] for management of medications and anxiety.   Risk Assessment: An assessment of suicide and violence risk factors was performed as part of this evaluation and is not significantly changed from the last visit.             While future psychiatric events cannot be accurately predicted, the patient does not currently require acute inpatient psychiatric care and does not currently meet [place]  involuntary commitment criteria.          Plan:  # GAD # Social anxiety disorder #Appetite suppression, chronic, improved Past medication trials:  Status of problem: New to this Clinical research associate Interventions: -- Continue mirtazapine  15 mg nightly for appetite suppression and anxiety -- Continue propranolol  10  mg 2 times daily as needed anxiety -- Patient prefers to defer therapy at this time.  Return to care in 10-12 weeks with Dr. Camillo Celestine after traveling. Patient advised that this writer will be leaving the practice in late June.  Patient was given contact information for behavioral health clinic and was instructed to call 911 for emergencies.    Patient and plan of care will be discussed with the Attending MD ,Dr. Eligio Grumbling, who agrees with the above statement and plan.   Subjective:  Chief Complaint:  Chief Complaint  Patient presents with   Follow-up   Medication Refill   Anxiety    Interval History: Patient reports things have been "much better." Her mood has been stable, and her anxiety substantially improved with current medications. She notes an improvement in interacting with co-workers, fearing eye contact much less.   The propranolol  has helped significantly with physical manifestations of anxiety.   She will be traveling to the [place] with family, to visit extended family, until August 11. They have not been in 8 years.   She has been sleeping well, and appetite improved, to normal level. She now eats 3 meals per day with snacks. Vegetarian by proxy, as she avoids meat. Typical meal- Veggies, pasta. Eats meats if mom makes them, chicken and fish. 4x/week.   She denies adverse effects of medications. She is medication compliant. She requires propranolol  4 days per week, on average, one time each day.   Denies SI, HI, AVH.   Denies tobacco, alcohol, drug use.  Denies caffeine consumptions   Visit Diagnosis:    ICD-10-CM  1. Social anxiety disorder  F40.10     2. GAD (generalized anxiety disorder)  F41.1       Past Psychiatric History:  Diagnoses: MDD, panic disorder, and social anxiety disorder as a child Medication trials: Mirtazapine  (2020-2021), Ambien  (2022)  Previous psychiatrist/therapist: Dr. Jerrold Morgan in 2022, recently established care with Earnie Gola,  LCAS for therapy.  Hospitalizations: Denies Suicide attempts: Denies SIB: Denies Hx of violence towards others: Denies Current access to guns: Denies Hx of trauma/abuse: Denies Denies: Seizures/head trauma Denies asthma.   Past Medical History:  Past Medical History:  Diagnosis Date   Depression    Normal labor and delivery 06/11/2021   Underweight 06/26/2019    Past Surgical History:  Procedure Laterality Date   NO PAST SURGERIES      Family Psychiatric History: Denies   Denies SA/[place]   Drugs: Brother  Family History:  Family History  Problem Relation Age of Onset   Colon cancer Neg Hx    Colon polyps Neg Hx     Social History:  Academic/Vocational:  54 year old, daughter. Married x 4 years. Bachelor's degree in IT. Mom lives 10 minutes from her. Good relationship with parents and siblings. From [place]. Here x 8 years. Born in [place], returned home, and returned to [place] 8 years ago.    -Denies legal charges.  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 Being around children does not trigger anxiety, but around other adults, as she fears being judged.  - She worries that she has difficulty  Social History   Socioeconomic History   Marital status: Married    Spouse name: Not on file   Number of children: 1   Years of education: Not on file   Highest education level: Not on file  Occupational History   Not on file  Tobacco Use   Smoking status: Never   Smokeless tobacco: Never  Vaping Use   Vaping status: Never Used  Substance and Sexual Activity   Alcohol use: Never   Drug use: Never   Sexual activity: Never  Other Topics Concern   Not on file  Social History Narrative   Not on file   Social Drivers of Health   Financial Resource Strain: Not on file  Food Insecurity: Not on file  Transportation Needs: Not on file  Physical Activity: Not on file  Stress: Not on file  Social Connections: Not on file    Allergies: No Known Allergies  Current  Medications: Current Outpatient Medications  Medication Sig Dispense Refill   mirtazapine  (REMERON ) 15 MG tablet Take 1 tablet (15 mg total) by mouth at bedtime. 30 tablet 1   Multiple Vitamins-Minerals (ALIVE MULTI-VITAMIN PO) Take 1 tablet by mouth daily.     propranolol  (INDERAL ) 10 MG tablet Take 1 tablet (10 mg total) by mouth 2 (two) times daily as needed. 60 tablet 1   SPRINTEC 28 0.25-35 MG-MCG tablet Take 1 tablet by mouth daily.     No current facility-administered medications for this visit.    ROS: Review of Systems  Objective:  Psychiatric Specialty Exam: Blood pressure 111/73, pulse 87, height 5\' 11"  (1.803 m), weight 159 lb 6.4 oz (72.3 kg), SpO2 100%.Body mass index is 22.23 kg/m.  General Appearance: Well Groomed  Eye Contact:  Good  Speech:  Clear and Coherent and Normal Rate  Volume:  Normal  Mood:  Euthymic  Affect:  Appropriate and Congruent  Thought Content: WDL and Logical   Suicidal Thoughts:  No  Homicidal Thoughts:  No  Thought Process:  Coherent, Goal Directed, and Linear  Orientation:  Full (Time, Place, and Person)    Memory: Immediate;   Good Recent;   Good Remote;   Good  Judgment:  Good  Insight:  Good  Concentration:  Concentration: Good and Attention Span: Good  Recall: not formally assessed  Fund of Knowledge: Good  Language: Good  Psychomotor Activity:  Normal  Akathisia:  No  AIMS (if indicated): not done  Assets:  Communication Skills Desire for Improvement Financial Resources/Insurance Housing Intimacy Leisure Time Physical Health Resilience Social Support Talents/Skills Transportation Vocational/Educational  ADL's:  Intact  Cognition: WNL  Sleep:  Good   PE: General: well-appearing; no acute distress Pulm: no increased work of breathing on room air Strength & Muscle Tone: within normal limits Neuro: no focal neurological deficits observed Gait & Station: normal  Metabolic Disorder Labs: No results found for:  "HGBA1C", "MPG" No results found for: "PROLACTIN" No results found for: "CHOL", "TRIG", "HDL", "CHOLHDL", "VLDL", "LDLCALC" Lab Results  Component Value Date   TSH 1.29 12/31/2021   TSH 1.87 06/26/2019    Therapeutic Level Labs: No results found for: "LITHIUM" No results found for: "VALPROATE" No results found for: "CBMZ"  Screenings: GAD-7    Flowsheet Row Counselor from 11/02/2023 in [facility]  Total GAD-7 Score 6      PHQ2-9    Flowsheet Row Counselor from 11/02/2023 in [facility] Office Visit from 06/26/2019 in [facility] [place]  PHQ-2 Total Score 1 0      Flowsheet Row ED from 11/30/2021 in [facility] Emergency Department at [facility] Admission (Discharged) from 06/11/2021 in [place] 4S Mother Baby Unit Admission (Discharged) from 06/10/2021 in [facility] 1S Maternity Assessment Unit  C-SSRS RISK CATEGORY No Risk No Risk No Risk       Collaboration of Care: Collaboration of Care: Dr. Eligio Grumbling  Patient/Guardian was advised Release of Information must be obtained prior to any record release in order to collaborate their care with an outside provider. Patient/Guardian was advised if they have not already done so to contact the registration department to sign all necessary forms in order for us  to release information regarding their care.   Consent: Patient/Guardian gives verbal consent for treatment and assignment of benefits for services provided during this visit. Patient/Guardian expressed understanding and agreed to proceed.   Shery Done, MD 12/09/2023, 1:28 PM

## 2024-02-15 NOTE — Progress Notes (Deleted)
 BH MD Outpatient Progress Note  02/15/2024 8:31 AM Lindsey Decker  MRN:  [id number]  Assessment:  Lindsey Decker presents for follow-up evaluation in-person. ***   Identifying Information: Lindsey Decker is a 28 y.o. female with a history of MDD, panic disorder, and remote social anxiety disorder  who is an established patient with [facility] for management of medications and anxiety.     Plan:  # GAD # Social anxiety disorder #Appetite suppression, chronic, improved -- Continue mirtazapine  15 mg nightly for appetite suppression and anxiety -- Continue propranolol  10 mg 2 times daily as needed anxiety -- Patient prefers to defer therapy at this time.   Subjective:  Chief Complaint:  No chief complaint on file.   Interval History: Patient seen ***.  Patient reports feeling *** today. Since the previous visit, ***. Regarding medications, patient notes ***. Patient reports the following adverse effects: ***.   Patient reports *** sleep, ***. Patient reports *** appetite, ***.   Patient rates anxiety a ***/10, depression a ***/10, and anger a ***/10.   Patient denies current SI, HI, and AVH. ***  Stressors include ***.   Substance use: ***  Denies tobacco, alcohol, drug use.  Denies caffeine consumptions   Visit Diagnosis:  No diagnosis found.   Past Psychiatric History:  Diagnoses: MDD, panic disorder, and social anxiety disorder as a child Medication trials: Mirtazapine  (2020-2021), Ambien  (2022)  Previous psychiatrist/therapist: Dr. Hinda in 2022, recently established care with Lindsey Decker, LCAS for therapy.  Hospitalizations: Denies Suicide attempts: Denies SIB: Denies Hx of violence towards others: Denies Current access to guns: Denies Hx of trauma/abuse: Denies Denies: Seizures/head trauma Denies asthma.   Past Medical History:  Past Medical History:  Diagnosis Date   Depression    Normal labor  and delivery 06/11/2021   Underweight 06/26/2019    Past Surgical History:  Procedure Laterality Date   NO PAST SURGERIES      Family Psychiatric History: Denies   Denies SA/[place]   Drugs: Brother  Family History:  Family History  Problem Relation Age of Onset   Colon cancer Neg Hx    Colon polyps Neg Hx     Social History:  Academic/Vocational:  35 year old, daughter. Married x 4 years. Bachelor's degree in IT. Mom lives 10 minutes from her. Good relationship with parents and siblings. From [place]. Here x 8 years. Born in [place], returned home, and returned to [place] 8 years ago.    -Denies legal charges.  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 Being around children does not trigger anxiety, but around other adults, as she fears being judged.  - She worries that she has difficulty  Social History   Socioeconomic History   Marital status: Married    Spouse name: Not on file   Number of children: 1   Years of education: Not on file   Highest education level: Not on file  Occupational History   Not on file  Tobacco Use   Smoking status: Never   Smokeless tobacco: Never  Vaping Use   Vaping status: Never Used  Substance and Sexual Activity   Alcohol use: Never   Drug use: Never   Sexual activity: Never  Other Topics Concern   Not on file  Social History Narrative   Not on file   Social Drivers of Health   Financial Resource Strain: Not on file  Food Insecurity: Not on file  Transportation Needs: Not on file  Physical  Activity: Not on file  Stress: Not on file  Social Connections: Not on file    Allergies: No Known Allergies  Current Medications: Current Outpatient Medications  Medication Sig Dispense Refill   mirtazapine  (REMERON ) 15 MG tablet Take 1 tablet (15 mg total) by mouth at bedtime. 90 tablet 0   Multiple Vitamins-Minerals (ALIVE MULTI-VITAMIN PO) Take 1 tablet by mouth daily.     propranolol  (INDERAL ) 10 MG tablet Take 1 tablet (10 mg total) by  mouth 2 (two) times daily as needed. 180 tablet 0   SPRINTEC 28 0.25-35 MG-MCG tablet Take 1 tablet by mouth daily.     No current facility-administered medications for this visit.    Objective: Psychiatric Specialty Exam: General Appearance: appears at stated age, casually dressed and groomed ***  Behavior: pleasant and cooperative ***  Psychomotor Activity: no psychomotor agitation or retardation noted ***  Eye Contact: fair *** Speech: normal amount, volume and fluency ***   Mood: euthymic *** Affect: congruent, pleasant and interactive ***  Thought Process: linear, goal directed, no circumstantial or tangential thought process noted, no racing thoughts or flight of ideas *** Descriptions of Associations: intact ***  Thought Content Hallucinations: denies AH, VH , does not appear responding to stimuli *** Delusions: no paranoia, delusions of control, grandeur, ideas of reference, thought broadcasting, and magical thinking *** Suicidal Thoughts: denies SI, intention, plan *** Homicidal Thoughts: denies HI, intention, plan ***  Alertness/Orientation: alert and fully oriented ***  Insight: fair*** Judgment: fair***  Memory: intact ***  Executive Functions  Concentration: intact *** Attention Span: fair *** Recall: intact *** Fund of Knowledge: fair ***  Physical Exam *** General: Pleasant, well-appearing ***. No acute distress. Pulmonary: Normal effort. No wheezing or rales. Skin: No obvious rash or lesions. Neuro: A&Ox3.No focal deficit.  Review of Systems *** No reported symptoms   Metabolic Disorder Labs: No results found for: HGBA1C, MPG No results found for: PROLACTIN No results found for: CHOL, TRIG, HDL, CHOLHDL, VLDL, LDLCALC Lab Results  Component Value Date   TSH 1.29 12/31/2021   TSH 1.87 06/26/2019    Therapeutic Level Labs: No results found for: LITHIUM No results found for: VALPROATE No results found for:  CBMZ  Screenings: GAD-7    Flowsheet Row Counselor from 11/02/2023 in [facility]  Total GAD-7 Score 6   PHQ2-9    Flowsheet Row Counselor from 11/02/2023 in [facility] Office Visit from 06/26/2019 in [facility] [place]  PHQ-2 Total Score 1 0   Flowsheet Row ED from 11/30/2021 in [facility] Emergency Department at [facility] Admission (Discharged) from 06/11/2021 in [place] 4S Mother Baby Unit Admission (Discharged) from 06/10/2021 in [facility] 1S Maternity Assessment Unit  C-SSRS RISK CATEGORY No Risk No Risk No Risk    Ismael Franco, MD PGY-3 Psychiatry Resident

## 2024-02-24 ENCOUNTER — Encounter (HOSPITAL_COMMUNITY): Admitting: Psychiatry

## 2024-02-24 ENCOUNTER — Telehealth (HOSPITAL_COMMUNITY): Payer: Self-pay | Admitting: Psychiatry

## 2024-02-24 NOTE — Telephone Encounter (Signed)
 Patient did not show up for the appointment.  Spoke with patient and she would like to reschedule appointment for 9/9 at 9:45 AM. Patient reports having enough medications to make it to that appointment.  Ismael Franco, MD PGY-3 Psychiatry Resident

## 2024-03-14 NOTE — Progress Notes (Deleted)
 BH MD Outpatient Progress Note  03/14/2024 11:21 AM Lindsey Decker  MRN:  [id number]  Assessment:  Lindsey Decker presents for follow-up evaluation in-person. ***    Plan:  # GAD # Social anxiety disorder # Appetite suppression, chronic, improved -- Continue mirtazapine  15 mg nightly for appetite suppression and anxiety -- Continue propranolol  10 mg 2 times daily as needed anxiety -- Patient prefers to defer therapy at this time.   Identifying Information: Lindsey Decker is a 28 y.o. female with a history of MDD, panic disorder, and remote social anxiety disorder  who is an established patient with [facility] for management of medications and anxiety.   Subjective:  Chief Complaint:  No chief complaint on file.   Interval History: Patient seen ***.  Patient reports feeling *** today. Since the previous visit, ***. Regarding medications, patient notes ***. Patient reports the following adverse effects: ***.   Patient reports *** sleep, ***. Patient reports *** appetite, ***.   Patient rates anxiety a ***/10, depression a ***/10, and anger a ***/10.   Patient denies current SI, HI, and AVH. ***  Stressors include ***.   Substance use: ***  Denies tobacco, alcohol, drug use.  Denies caffeine consumptions   Visit Diagnosis:  No diagnosis found.   Past Psychiatric History:  Diagnoses: MDD, panic disorder, and social anxiety disorder as a child Medication trials: Mirtazapine  (2020-2021), Ambien  (2022)  Previous psychiatrist/therapist: Dr. Hinda in 2022, recently established care with Darice Simpler, LCAS for therapy.  Hospitalizations: Denies Suicide attempts: Denies SIB: Denies Hx of violence towards others: Denies Current access to guns: Denies Hx of trauma/abuse: Denies Denies: Seizures/head trauma Denies asthma.   Social History:  From [place]. Here x 8 years. Born in [place], returned home, and returned to [place] 8  years ago.  Married x 4 years. 19 year old, daughter.  Bachelor's degree in IT.  Mom lives 10 minutes from her. Good relationship with parents and siblings.    -Denies legal charges.  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 Being around children does not trigger anxiety, but around other adults, as she fears being judged.   Past Medical History:  Past Medical History:  Diagnosis Date   Depression    Normal labor and delivery 06/11/2021   Underweight 06/26/2019    Past Surgical History:  Procedure Laterality Date   NO PAST SURGERIES      Family Psychiatric History: Denies   Denies SA/[place]   Drugs: Brother  Family History:  Family History  Problem Relation Age of Onset   Colon cancer Neg Hx    Colon polyps Neg Hx     Social History   Socioeconomic History   Marital status: Married    Spouse name: Not on file   Number of children: 1   Years of education: Not on file   Highest education level: Not on file  Occupational History   Not on file  Tobacco Use   Smoking status: Never   Smokeless tobacco: Never  Vaping Use   Vaping status: Never Used  Substance and Sexual Activity   Alcohol use: Never   Drug use: Never   Sexual activity: Never  Other Topics Concern   Not on file  Social History Narrative   Not on file   Social Drivers of Health   Financial Resource Strain: Not on file  Food Insecurity: Not on file  Transportation Needs: Not on file  Physical Activity: Not on file  Stress:  Not on file  Social Connections: Not on file    Allergies: No Known Allergies  Current Medications: Current Outpatient Medications  Medication Sig Dispense Refill   mirtazapine  (REMERON ) 15 MG tablet Take 1 tablet (15 mg total) by mouth at bedtime. 90 tablet 0   Multiple Vitamins-Minerals (ALIVE MULTI-VITAMIN PO) Take 1 tablet by mouth daily.     propranolol  (INDERAL ) 10 MG tablet Take 1 tablet (10 mg total) by mouth 2 (two) times daily as needed. 180 tablet 0    SPRINTEC 28 0.25-35 MG-MCG tablet Take 1 tablet by mouth daily.     No current facility-administered medications for this visit.    Objective: Psychiatric Specialty Exam: General Appearance: appears at stated age, casually dressed and groomed ***  Behavior: pleasant and cooperative ***  Psychomotor Activity: no psychomotor agitation or retardation noted ***  Eye Contact: fair *** Speech: normal amount, volume and fluency ***   Mood: euthymic *** Affect: congruent, pleasant and interactive ***  Thought Process: linear, goal directed, no circumstantial or tangential thought process noted, no racing thoughts or flight of ideas *** Descriptions of Associations: intact ***  Thought Content Hallucinations: denies AH, VH , does not appear responding to stimuli *** Delusions: no paranoia, delusions of control, grandeur, ideas of reference, thought broadcasting, and magical thinking *** Suicidal Thoughts: denies SI, intention, plan *** Homicidal Thoughts: denies HI, intention, plan ***  Alertness/Orientation: alert and fully oriented ***  Insight: fair*** Judgment: fair***  Memory: intact ***  Executive Functions  Concentration: intact *** Attention Span: fair *** Recall: intact *** Fund of Knowledge: fair ***  Physical Exam *** General: Pleasant, well-appearing ***. No acute distress. Pulmonary: Normal effort. No wheezing or rales. Skin: No obvious rash or lesions. Neuro: A&Ox3.No focal deficit.  Review of Systems *** No reported symptoms   Metabolic Disorder Labs: No results found for: HGBA1C, MPG No results found for: PROLACTIN No results found for: CHOL, TRIG, HDL, CHOLHDL, VLDL, LDLCALC Lab Results  Component Value Date   TSH 1.29 12/31/2021   TSH 1.87 06/26/2019    Therapeutic Level Labs: No results found for: LITHIUM No results found for: VALPROATE No results found for: CBMZ  Screenings: GAD-7    Flowsheet Row Counselor from  11/02/2023 in [facility]  Total GAD-7 Score 6   PHQ2-9    Flowsheet Row Counselor from 11/02/2023 in [facility] Office Visit from 06/26/2019 in [facility] [place]  PHQ-2 Total Score 1 0   Flowsheet Row ED from 11/30/2021 in [facility] Emergency Department at [facility] Admission (Discharged) from 06/11/2021 in [place] 4S Mother Baby Unit Admission (Discharged) from 06/10/2021 in [facility] 1S Maternity Assessment Unit  C-SSRS RISK CATEGORY No Risk No Risk No Risk    Ismael Franco, MD PGY-3 Psychiatry Resident

## 2024-03-21 ENCOUNTER — Encounter (HOSPITAL_COMMUNITY): Admitting: Psychiatry

## 2024-03-29 ENCOUNTER — Ambulatory Visit: Admitting: Family Medicine

## 2024-04-06 ENCOUNTER — Ambulatory Visit (INDEPENDENT_AMBULATORY_CARE_PROVIDER_SITE_OTHER): Admitting: Family Medicine

## 2024-04-06 VITALS — BP 100/62 | HR 78 | Temp 98.2°F | Ht 71.0 in | Wt 151.0 lb

## 2024-04-06 DIAGNOSIS — Z8619 Personal history of other infectious and parasitic diseases: Secondary | ICD-10-CM | POA: Diagnosis not present

## 2024-04-06 DIAGNOSIS — Z8639 Personal history of other endocrine, nutritional and metabolic disease: Secondary | ICD-10-CM | POA: Diagnosis not present

## 2024-04-06 DIAGNOSIS — R5383 Other fatigue: Secondary | ICD-10-CM | POA: Insufficient documentation

## 2024-04-06 DIAGNOSIS — E559 Vitamin D deficiency, unspecified: Secondary | ICD-10-CM | POA: Diagnosis not present

## 2024-04-06 DIAGNOSIS — Z7689 Persons encountering health services in other specified circumstances: Secondary | ICD-10-CM | POA: Diagnosis not present

## 2024-04-06 NOTE — Progress Notes (Signed)
 New Patient Office Visit  Subjective    Patient ID: Seryna Marek, female    DOB: 06-27-1996  Age: 28 y.o. MRN: [id number]  CC:  Chief Complaint  Patient presents with   Establish Care    Check Vitamin level and H.Pylori test    HPI Renesme Kerrigan Gremillion presents to establish care with this practice. She is new to me.   Fatigue: Low energy. Exhausted after simple task. Symptoms present for years.   Vitamin check: No numbness or tingling. History of vitamin D  deficiency.    Positive for h-pylori in the past. Treated for it. Weight loss. Does not eat much. Nausea with eating. No vomiting. No abdominal pain.   Depression: Under control     Outpatient Encounter Medications as of 04/06/2024  Medication Sig   Multiple Vitamins-Minerals (ALIVE MULTI-VITAMIN PO) Take 1 tablet by mouth daily.   mirtazapine  (REMERON ) 15 MG tablet Take 1 tablet (15 mg total) by mouth at bedtime. (Patient not taking: Reported on 04/06/2024)   propranolol  (INDERAL ) 10 MG tablet Take 1 tablet (10 mg total) by mouth 2 (two) times daily as needed. (Patient not taking: Reported on 04/06/2024)   SPRINTEC 28 0.25-35 MG-MCG tablet Take 1 tablet by mouth daily. (Patient not taking: Reported on 04/06/2024)   No facility-administered encounter medications on file as of 04/06/2024.    Past Medical History:  Diagnosis Date   Depression    Normal labor and delivery 06/11/2021   Underweight 06/26/2019    Past Surgical History:  Procedure Laterality Date   NO PAST SURGERIES      Family History  Problem Relation Age of Onset   Colon cancer Neg Hx    Colon polyps Neg Hx     Social History   Socioeconomic History   Marital status: Married    Spouse name: Not on file   Number of children: 1   Years of education: Not on file   Highest education level: Bachelor's degree (e.g., BA, AB, BS)  Occupational History   Not on file  Tobacco Use   Smoking status: Never   Smokeless  tobacco: Never  Vaping Use   Vaping status: Never Used  Substance and Sexual Activity   Alcohol use: Never   Drug use: Never   Sexual activity: Never  Other Topics Concern   Not on file  Social History Narrative   Not on file   Social Drivers of Health   Financial Resource Strain: Low Risk  (04/06/2024)   Overall Financial Resource Strain (CARDIA)    Difficulty of Paying Living Expenses: Not hard at all  Food Insecurity: No Food Insecurity (04/06/2024)   Hunger Vital Sign    Worried About Running Out of Food in the Last Year: Never true    Ran Out of Food in the Last Year: Never true  Transportation Needs: No Transportation Needs (04/06/2024)   PRAPARE - Administrator, Civil Service (Medical): No    Lack of Transportation (Non-Medical): No  Physical Activity: Insufficiently Active (04/06/2024)   Exercise Vital Sign    Days of Exercise per Week: 1 day    Minutes of Exercise per Session: 20 min  Stress: No Stress Concern Present (04/06/2024)   [employer] of Occupational Health - Occupational Stress Questionnaire    Feeling of Stress: Only a little  Social Connections: Moderately Isolated (04/06/2024)   Social Connection and Isolation Panel    Frequency of Communication with Friends and Family: Three times a week  Frequency of Social Gatherings with Friends and Family: Once a week    Attends Religious Services: Never    Database administrator or Organizations: No    Attends Engineer, structural: Not on file    Marital Status: Married  Catering manager Violence: Not on file    ROS      Objective    BP 100/62   Pulse 78   Temp 98.2 F (36.8 C) (Oral)   Ht 5' 11 (1.803 m)   Wt 151 lb (68.5 kg)   LMP 03/14/2024 (Exact Date)   SpO2 98%   BMI 21.06 kg/m   Physical Exam Vitals and nursing note reviewed.  Constitutional:      General: She is not in acute distress.    Appearance: Normal appearance. She is normal weight.  Cardiovascular:      Rate and Rhythm: Normal rate and regular rhythm.     Heart sounds: Normal heart sounds.  Pulmonary:     Effort: Pulmonary effort is normal.     Breath sounds: Normal breath sounds.  Skin:    General: Skin is warm and dry.  Neurological:     General: No focal deficit present.     Mental Status: She is alert. Mental status is at baseline.  Psychiatric:        Mood and Affect: Mood normal.        Behavior: Behavior normal.        Thought Content: Thought content normal.        Judgment: Judgment normal.         Assessment & Plan:   Problem List Items Addressed This Visit     Vitamin D  deficiency, unspecified   Vitamin D  check today. Treatment based on results.       Establishing care with new doctor, encounter for - Primary   Other fatigue   Symptoms present for years. Low energy. Feels exhausted with simple task. Will get labs today. Follow-up based on results. Denies risk of pregnancy.       Relevant Orders   CBC with Differential/Platelet   Comprehensive metabolic panel with GFR   Hemoglobin A1c   Iron, TIBC and Ferritin Panel   VITAMIN D  25 Hydroxy (Vit-D Deficiency, Fractures)   B12 and Folate Panel   TSH+T4F+T3Free   History of Helicobacter pylori infection   Breath test today. Denies recent PPI use. Treatment based on results.       Relevant Orders   H. pylori breath test   Other Visit Diagnoses       History of vitamin D  deficiency       Relevant Orders   VITAMIN D  25 Hydroxy (Vit-D Deficiency, Fractures)     Agrees with plan of care discussed.  Questions answered.   Return if symptoms worsen or fail to improve.   Darice JONELLE Brownie, FNP

## 2024-04-06 NOTE — Assessment & Plan Note (Signed)
 Breath test today. Denies recent PPI use. Treatment based on results.

## 2024-04-06 NOTE — Assessment & Plan Note (Signed)
 Symptoms present for years. Low energy. Feels exhausted with simple task. Will get labs today. Follow-up based on results. Denies risk of pregnancy.

## 2024-04-06 NOTE — Assessment & Plan Note (Signed)
 Vitamin D  check today. Treatment based on results.

## 2024-04-07 LAB — COMPREHENSIVE METABOLIC PANEL WITH GFR
ALT: 6 IU/L (ref 0–32)
AST: 15 IU/L (ref 0–40)
Albumin: 4.6 g/dL (ref 4.0–5.0)
Alkaline Phosphatase: 88 IU/L (ref 41–116)
BUN/Creatinine Ratio: 18 (ref 9–23)
BUN: 12 mg/dL (ref 6–20)
Bilirubin Total: 0.7 mg/dL (ref 0.0–1.2)
CO2: 19 mmol/L — ABNORMAL LOW (ref 20–29)
Calcium: 9.9 mg/dL (ref 8.7–10.2)
Chloride: 104 mmol/L (ref 96–106)
Creatinine, Ser: 0.67 mg/dL (ref 0.57–1.00)
Globulin, Total: 3.1 g/dL (ref 1.5–4.5)
Glucose: 95 mg/dL (ref 70–99)
Potassium: 4.2 mmol/L (ref 3.5–5.2)
Sodium: 140 mmol/L (ref 134–144)
Total Protein: 7.7 g/dL (ref 6.0–8.5)
eGFR: 124 mL/min/1.73 (ref >59)

## 2024-04-07 LAB — IRON,TIBC AND FERRITIN PANEL
Ferritin: 39 ng/mL (ref 15–150)
Iron Saturation: 29 % (ref 15–55)
Iron: 102 ug/dL (ref 27–159)
Total Iron Binding Capacity: 355 ug/dL (ref 250–450)
UIBC: 253 ug/dL (ref 131–425)

## 2024-04-07 LAB — CBC WITH DIFFERENTIAL/PLATELET
Basophils Absolute: 0 x10E3/uL (ref 0.0–0.2)
Basos: 1 %
EOS (ABSOLUTE): 0.2 x10E3/uL (ref 0.0–0.4)
Eos: 3 %
Hematocrit: 41.9 % (ref 34.0–46.6)
Hemoglobin: 13.9 g/dL (ref 11.1–15.9)
Immature Grans (Abs): 0 x10E3/uL (ref 0.0–0.1)
Immature Granulocytes: 0 %
Lymphocytes Absolute: 2.6 x10E3/uL (ref 0.7–3.1)
Lymphs: 42 %
MCH: 28.5 pg (ref 26.6–33.0)
MCHC: 33.2 g/dL (ref 31.5–35.7)
MCV: 86 fL (ref 79–97)
Monocytes Absolute: 0.4 x10E3/uL (ref 0.1–0.9)
Monocytes: 7 %
Neutrophils Absolute: 3 x10E3/uL (ref 1.4–7.0)
Neutrophils: 47 %
Platelets: 305 x10E3/uL (ref 150–450)
RBC: 4.87 x10E6/uL (ref 3.77–5.28)
RDW: 13.3 % (ref 11.7–15.4)
WBC: 6.3 x10E3/uL (ref 3.4–10.8)

## 2024-04-07 LAB — H. PYLORI BREATH TEST: H pylori Breath Test: NEGATIVE

## 2024-04-07 LAB — HEMOGLOBIN A1C
Est. average glucose Bld gHb Est-mCnc: 103 mg/dL
Hgb A1c MFr Bld: 5.2 % (ref 4.8–5.6)

## 2024-04-07 LAB — TSH+T4F+T3FREE
Free T4: 1.2 ng/dL (ref 0.82–1.77)
T3, Free: 2.9 pg/mL (ref 2.0–4.4)
TSH: 1.28 u[IU]/mL (ref 0.450–4.500)

## 2024-04-07 LAB — VITAMIN D 25 HYDROXY (VIT D DEFICIENCY, FRACTURES): Vit D, 25-Hydroxy: 35.6 ng/mL (ref 30.0–100.0)

## 2024-04-07 LAB — B12 AND FOLATE PANEL
Folate: >20 ng/mL (ref >3.0)
Vitamin B-12: 640 pg/mL (ref 232–1245)

## 2024-04-07 LAB — H. PYLORI BREATH COLLECTION

## 2024-04-09 ENCOUNTER — Ambulatory Visit: Payer: Self-pay | Admitting: Family Medicine
